# Patient Record
Sex: Male | Born: 1990
Health system: Southern US, Community
[De-identification: ages and names within clinical notes are randomized; demographics above are authoritative.]

## PROBLEM LIST (undated history)

## (undated) DIAGNOSIS — R569 Unspecified convulsions: Secondary | ICD-10-CM

## (undated) DIAGNOSIS — I509 Heart failure, unspecified: Secondary | ICD-10-CM

## (undated) DIAGNOSIS — M329 Systemic lupus erythematosus, unspecified: Secondary | ICD-10-CM

## (undated) DIAGNOSIS — B20 Human immunodeficiency virus [HIV] disease: Secondary | ICD-10-CM

## (undated) DIAGNOSIS — Z21 Asymptomatic human immunodeficiency virus [HIV] infection status: Secondary | ICD-10-CM

## (undated) DIAGNOSIS — IMO0002 Reserved for concepts with insufficient information to code with codable children: Secondary | ICD-10-CM

## (undated) DIAGNOSIS — F319 Bipolar disorder, unspecified: Secondary | ICD-10-CM

## (undated) HISTORY — PX: TONSILLECTOMY: SUR1361

---

## 2012-01-07 LAB — URINALYSIS W/ RFLX MICROSCOPIC
Bilirubin: NEGATIVE
Blood: NEGATIVE
Glucose: NEGATIVE MG/DL
Ketone: NEGATIVE MG/DL
Leukocyte Esterase: NEGATIVE
Nitrites: NEGATIVE
Protein: NEGATIVE MG/DL
Specific gravity: 1.025 (ref 1.003–1.030)
Urobilinogen: 0.2 EU/DL (ref 0.2–1.0)
pH (UA): 6.5 (ref 5.0–8.0)

## 2012-01-07 LAB — INFLUENZA A & B AG (RAPID TEST)
Influenza A Antigen: NEGATIVE
Influenza B Antigen: NEGATIVE

## 2012-01-07 MED ORDER — HYDROCODONE 10 MG-CHLORPHENIRAMINE 8 MG/5 ML ORAL SUSP EXTEND.REL 12HR
10-8 mg/5 mL | Freq: Two times a day (BID) | ORAL | Status: AC | PRN
Start: 2012-01-07 — End: ?

## 2012-01-07 MED ORDER — CEFTRIAXONE 250 MG SOLUTION FOR INJECTION
250 mg | INTRAMUSCULAR | Status: AC
Start: 2012-01-07 — End: 2012-01-07
  Administered 2012-01-07: 23:00:00 via INTRAMUSCULAR

## 2012-01-07 MED ORDER — AZITHROMYCIN 250 MG TAB
250 mg | ORAL | Status: AC
Start: 2012-01-07 — End: 2012-01-07
  Administered 2012-01-07: 23:00:00 via ORAL

## 2012-01-07 MED ORDER — METOPROLOL TARTRATE 50 MG TAB
50 mg | ORAL_TABLET | Freq: Two times a day (BID) | ORAL | Status: AC
Start: 2012-01-07 — End: ?

## 2012-01-07 NOTE — ED Notes (Signed)
P.a. Present in room now, vitals stable. No resp. Distress present, speaks in complete sentences.

## 2012-01-07 NOTE — ED Provider Notes (Signed)
I was personally available for consultation in the emergency department.  I have reviewed the chart and agree with the documentation recorded by the MLP, including the assessment, treatment plan, and disposition.  Marsella Suman L Kiante Ciavarella, MD

## 2012-01-07 NOTE — Discharge Instructions (Signed)
Talked with patient and a current copy of CAV schedule and medication discount card given to him. He is not able to afford [facility] copayment. Juliene Pina, Life Coach

## 2012-01-07 NOTE — ED Provider Notes (Addendum)
Patient is a 21 y.o. male presenting with vomiting and hand pain. The history is provided by the patient.   Vomiting   This is a new problem. Associated symptoms include a fever and cough.   Hand Pain      Pt presents c/o a productive cough, a fever, and CP with coughing.  Denies n/v/d, rash, SOB.  H/o HTN but hasn't taken Lopressor since Thanksgiving and doesn't have a PCP.      Past Medical History   Diagnosis Date   ??? Seizures      epilepsy   ??? Hypertension         Past Surgical History   Procedure Date   ??? Hx tonsillectomy    ??? Hx tonsillectomy          No family history on file.     History     Social History   ??? Marital Status: DIVORCED     Spouse Name: N/A     Number of Children: N/A   ??? Years of Education: N/A     Occupational History   ??? Not on file.     Social History Main Topics   ??? Smoking status: Current Some Day Smoker   ??? Smokeless tobacco: Not on file   ??? Alcohol Use: No   ??? Drug Use: No   ??? Sexually Active:      Other Topics Concern   ??? Not on file     Social History Narrative   ??? No narrative on file                  ALLERGIES: Amoxicillin      Review of Systems   Constitutional: Positive for fever.   Respiratory: Positive for cough.    Cardiovascular: Positive for chest pain.   Gastrointestinal: Positive for vomiting.       Filed Vitals:    01/07/12 1521 01/07/12 1616   BP: 137/81 142/84   Pulse: 87 78   Temp: 98.3 ??F (36.8 ??C)    Resp: 16 16   Height: 5\' 5"  (1.651 m)    Weight: 70.761 kg (156 lb)    SpO2: 100% 100%            Physical Exam   Constitutional: He is oriented to person, place, and time. He appears well-developed and well-nourished. No distress.   HENT:   Head: Normocephalic and atraumatic.   Eyes: EOM are normal.   Neck: Normal range of motion. Neck supple.   Cardiovascular: Normal rate and regular rhythm.    Pulmonary/Chest: Effort normal. No respiratory distress. He has no wheezes. He has no rales. He exhibits no tenderness.   Abdominal: Soft. There is no tenderness.    Musculoskeletal: He exhibits no edema.   Neurological: He is alert and oriented to person, place, and time. No cranial nerve deficit. Coordination normal.   Skin: Skin is warm and dry.   Psychiatric: His behavior is normal.        MDM     Differential Diagnosis; Clinical Impression; Plan:     Pt with cough and normal CXR; VS good and is afebrile.  Negative influenza.  Discussed importance of HTN meds w/pt.  Life coach assistance needed for f/up.  Pt also c/o dysuria to nurse.  Will treat for STI.  Amount and/or Complexity of Data Reviewed:   Clinical lab tests:  Ordered and reviewed  Tests in the radiology section of CPT??:  Ordered and reviewed  Discuss the patient with another provider:  Yes      Procedures

## 2012-01-07 NOTE — ED Notes (Signed)
laast seizure was Dec.25th,2012, has not been taking his meds for about 3-4 weeks,"ran out, no primary m.d., just moved".

## 2012-03-18 ENCOUNTER — Inpatient Hospital Stay
Admit: 2012-03-18 | Discharge: 2012-03-19 | Discharge status: Against Medical Advice | Payer: Self-pay | Attending: Internal Medicine | Admitting: Internal Medicine

## 2012-03-18 DIAGNOSIS — G40401 Other generalized epilepsy and epileptic syndromes, not intractable, with status epilepticus: Secondary | ICD-10-CM

## 2012-03-18 LAB — PHENOBARBITAL LEVEL: Phenobarbital: <2.1 ug/mL — ABNORMAL LOW (ref 15.0–40.0)

## 2012-03-18 LAB — PTT: aPTT: 29.4 s (ref 24.6–37.7)

## 2012-03-18 LAB — DRUG SCREEN, URINE
AMPHETAMINES: NEGATIVE
BARBITURATES: NEGATIVE
BENZODIAZEPINES: NEGATIVE
COCAINE: POSITIVE — AB
METHADONE: NEGATIVE
OPIATES: NEGATIVE
PCP(PHENCYCLIDINE): NEGATIVE
THC (TH-CANNABINOL): POSITIVE — AB

## 2012-03-18 LAB — CBC WITH AUTOMATED DIFF
ABS. BASOPHILS: 0 10*3/uL (ref 0.0–0.06)
ABS. EOSINOPHILS: 0.1 10*3/uL (ref 0.0–0.4)
ABS. LYMPHOCYTES: 2.2 10*3/uL (ref 0.9–3.6)
ABS. MONOCYTES: 0.3 10*3/uL (ref 0.05–1.2)
ABS. NEUTROPHILS: 2.2 10*3/uL (ref 1.8–8.0)
BASOPHILS: 1 % (ref 0–2)
EOSINOPHILS: 3 % (ref 0–5)
HCT: 46.6 % (ref 36.0–48.0)
HGB: 16.1 g/dL — ABNORMAL HIGH (ref 13.0–16.0)
LYMPHOCYTES: 45 % (ref 21–52)
MCH: 32.3 PG (ref 24.0–34.0)
MCHC: 34.5 g/dL (ref 31.0–37.0)
MCV: 93.4 FL (ref 74.0–97.0)
MONOCYTES: 7 % (ref 3–10)
MPV: 9.8 FL (ref 9.2–11.8)
NEUTROPHILS: 44 % (ref 40–73)
PLATELET: 290 10*3/uL (ref 135–420)
RBC: 4.99 M/uL (ref 4.70–5.50)
RDW: 12.9 % (ref 11.6–14.5)
WBC: 4.9 10*3/uL (ref 4.6–13.2)

## 2012-03-18 LAB — EKG, 12 LEAD, INITIAL
Atrial Rate: 101 {beats}/min
Calculated P Axis: 62 degrees
Calculated R Axis: 67 degrees
Calculated T Axis: 39 degrees
P-R Interval: 124 ms
Q-T Interval: 314 ms
QRS Duration: 80 ms
QTC Calculation (Bezet): 407 ms
Ventricular Rate: 101 {beats}/min

## 2012-03-18 LAB — METABOLIC PANEL, BASIC
Anion gap: 15 mmol/L (ref 3.0–18)
Anion gap: 9 mmol/L (ref 3.0–18)
BUN/Creatinine ratio: 12 (ref 12–20)
BUN/Creatinine ratio: 11 — ABNORMAL LOW (ref 12–20)
BUN: 17 MG/DL (ref 7.0–18)
BUN: 13 MG/DL (ref 7.0–18)
CO2: 18 MMOL/L — ABNORMAL LOW (ref 21–32)
CO2: 25 MMOL/L (ref 21–32)
Calcium: 8.8 MG/DL (ref 8.5–10.1)
Calcium: 9 MG/DL (ref 8.5–10.1)
Chloride: 105 MMOL/L (ref 100–108)
Chloride: 107 MMOL/L (ref 100–108)
Creatinine: 1.39 MG/DL — ABNORMAL HIGH (ref 0.6–1.3)
Creatinine: 1.23 MG/DL (ref 0.6–1.3)
GFR est AA: >60 mL/min/{1.73_m2} (ref >60)
GFR est AA: >60 mL/min/{1.73_m2} (ref >60)
GFR est non-AA: >60 mL/min/{1.73_m2} (ref >60)
GFR est non-AA: >60 mL/min/{1.73_m2} (ref >60)
Glucose: 101 MG/DL — ABNORMAL HIGH (ref 74–99)
Glucose: 87 MG/DL (ref 74–99)
Potassium: 3.5 MMOL/L (ref 3.5–5.5)
Potassium: 3.7 MMOL/L (ref 3.5–5.5)
Sodium: 138 MMOL/L (ref 136–145)
Sodium: 141 MMOL/L (ref 136–145)

## 2012-03-18 LAB — URINALYSIS W/ RFLX MICROSCOPIC
Bilirubin: NEGATIVE
Blood: NEGATIVE
Glucose: NEGATIVE MG/DL
Ketone: 15 MG/DL — AB
Leukocyte Esterase: NEGATIVE
Nitrites: NEGATIVE
Specific gravity: 1.025 (ref 1.003–1.030)
Urobilinogen: 0.2 EU/DL (ref 0.2–1.0)
pH (UA): 6.5 (ref 5.0–8.0)

## 2012-03-18 LAB — CARDIAC PANEL,(CK, CKMB & TROPONIN)
CK - MB: 1.6 ng/ml (ref 0.5–3.6)
CK-MB Index: 0.5 % (ref 0.0–4.0)
CK: 312 U/L — ABNORMAL HIGH (ref 39–308)
Troponin-I, QT: <0.02 NG/ML (ref 0.0–0.045)

## 2012-03-18 LAB — LACTIC ACID: Lactic acid: 8.5 MMOL/L — ABNORMAL HIGH (ref 0.4–2.0)

## 2012-03-18 LAB — PROTHROMBIN TIME + INR
INR: 1.1 (ref 0.8–1.2)
Prothrombin time: 14.2 s (ref 11.5–15.2)

## 2012-03-18 LAB — HEPATIC FUNCTION PANEL
A-G Ratio: 1.5 (ref 0.8–1.7)
ALT (SGPT): 24 U/L (ref 12.0–78.0)
AST (SGOT): 20 U/L (ref 15–37)
Albumin: 4.6 g/dL (ref 3.4–5.0)
Alk. phosphatase: 87 U/L (ref 50–136)
Bilirubin, direct: 0.2 MG/DL (ref 0.0–0.2)
Bilirubin, total: 0.6 MG/DL (ref 0.2–1.0)
Globulin: 3 g/dL (ref 2.0–4.0)
Protein, total: 7.6 g/dL (ref 6.4–8.2)

## 2012-03-18 LAB — ETHYL ALCOHOL: ALCOHOL(ETHYL),SERUM: <3 MG/DL (ref 0–3)

## 2012-03-18 LAB — URINE MICROSCOPIC ONLY
Bacteria: NEGATIVE /HPF
RBC: 0 /HPF (ref 0–5)
WBC: 0 /HPF (ref 0–4)

## 2012-03-18 LAB — MAGNESIUM: Magnesium: 1.8 MG/DL (ref 1.8–2.4)

## 2012-03-18 LAB — GLUCOSE, POC: Glucose (POC): 95 mg/dL (ref 70–110)

## 2012-03-18 LAB — CARBAMAZEPINE: Carbamazepine: <0.5 ug/mL — ABNORMAL LOW (ref 4–12)

## 2012-03-18 LAB — PHENYTOIN: Phenytoin: 0.8 ug/mL — ABNORMAL LOW (ref 10.0–20.0)

## 2012-03-18 LAB — PHOSPHORUS: Phosphorus: 2.8 MG/DL (ref 2.5–4.9)

## 2012-03-18 MED ORDER — POTASSIUM CHLORIDE 10 MEQ/100 ML IV PIGGY BACK
10 mEq/0 mL | INTRAVENOUS | Status: AC
Start: 2012-03-18 — End: 2012-03-18
  Administered 2012-03-18 – 2012-03-19 (×2): via INTRAVENOUS

## 2012-03-18 MED ORDER — LORAZEPAM 2 MG/ML IJ SOLN
2 mg/mL | INTRAMUSCULAR | Status: AC
Start: 2012-03-18 — End: 2012-03-19

## 2012-03-18 MED ORDER — LORAZEPAM 2 MG/ML IJ SOLN
2 mg/mL | INTRAMUSCULAR | Status: AC
Start: 2012-03-18 — End: 2012-03-18
  Administered 2012-03-18: 13:00:00 via INTRAVENOUS

## 2012-03-18 MED ADMIN — levETIRAcetam (KEPPRA) 1,000 mg in 0.9% sodium chloride IVPB: INTRAVENOUS | @ 14:00:00 | NDC 39822400001

## 2012-03-18 MED ADMIN — LORazepam (ATIVAN) 2 mg/mL injection: INTRAVENOUS | @ 18:00:00 | NDC 00641604401

## 2012-03-18 MED ADMIN — enoxaparin (LOVENOX) injection 40 mg: SUBCUTANEOUS | @ 23:00:00 | NDC 00075062040

## 2012-03-18 MED ADMIN — dextrose 5% and 0.9% NaCl infusion: INTRAVENOUS | @ 23:00:00 | NDC 00409794109

## 2012-03-18 MED ADMIN — LORazepam (ATIVAN) 2 mg/mL injection: INTRAVENOUS | @ 13:00:00 | NDC 00641604401

## 2012-03-18 MED ADMIN — ondansetron (ZOFRAN) 4 mg/2 mL injection: @ 13:00:00 | NDC 00781301072

## 2012-03-18 MED ADMIN — levETIRAcetam (KEPPRA) 1,000 mg in 0.9% sodium chloride IVPB: INTRAVENOUS | @ 23:00:00 | NDC 39822400001

## 2012-03-18 MED ADMIN — fosphenytoin (CEREBYX) 1,000 mg PE in 0.9% sodium chloride 100 mL IVPB: INTRAVENOUS | @ 19:00:00 | NDC 63323040310

## 2012-03-18 MED FILL — LORAZEPAM 2 MG/ML IJ SOLN: 2 mg/mL | INTRAMUSCULAR | Qty: 1

## 2012-03-18 MED FILL — LEVETIRACETAM 500 MG/5 ML IV SOLN: 500 mg/5 mL | INTRAVENOUS | Qty: 10

## 2012-03-18 MED FILL — POTASSIUM CHLORIDE 10 MEQ/100 ML IV PIGGY BACK: 10 mEq/0 mL | INTRAVENOUS | Qty: 100

## 2012-03-18 MED FILL — DEXTROSE 5% IN NORMAL SALINE IV: INTRAVENOUS | Qty: 1000

## 2012-03-18 MED FILL — FLUARIX 2012-2013(PF) 45 MCG (15 MCG X 3)/0.5 ML INTRAMUSCULAR SYRINGE: 45 mcg (15 mcg x 3)/0.5 mL | INTRAMUSCULAR | Qty: 0.5

## 2012-03-18 MED FILL — FOSPHENYTOIN 500 MG PE/10 ML INJECTION: 500 mg PE/10 mL | INTRAMUSCULAR | Qty: 20

## 2012-03-18 MED FILL — ONDANSETRON (PF) 4 MG/2 ML INJECTION: 4 mg/2 mL | INTRAMUSCULAR | Qty: 2

## 2012-03-18 MED FILL — LOVENOX 40 MG/0.4 ML SUBCUTANEOUS SYRINGE: 40 mg/0.4 mL | SUBCUTANEOUS | Qty: 0.4

## 2012-03-18 NOTE — Other (Signed)
Pt asleep, no distress noted

## 2012-03-18 NOTE — ED Notes (Signed)
Patient now speaking with his male friend at gurney side, clarifying medication he is to take, he has not been taking for the last 2 years, and has about 2 seizures a year, last episode last summer. No seizure activity present, vitals stable. Tele nsr no ectopy present.

## 2012-03-18 NOTE — Consults (Signed)
Long history of seizures. He stopped medication when he lost his job, but he has positive drug screen for cocaine and THC.   Dictated, [id number]

## 2012-03-18 NOTE — ED Notes (Signed)
Eyes open, slurred speech, no seizure activity present, vitals stable, tele nsr

## 2012-03-18 NOTE — Other (Signed)
Pt co pain to IV site with potassium infusion, site intact, pt refused further infusion, Dr. Orlinda Blalock aware, orders received to D/C remaining KCL infusions

## 2012-03-18 NOTE — Consults (Signed)
[facility]                  [address] [place], [place]  [address]                                CONSULTATION REPORT    PATIENT:      Tony Mccarthy, Tony Mccarthy  MRN:              [id number]     ADMITTED:   03/18/2012  BILLING:          [id number]    CONSULTED:  03/18/2012  LOCATION:     [id number]  ATTENDING:    Kristie Cowman, MD  CONSULTING:   Dorna Leitz, MD        REFERRING PHYSICIAN: Dr. Robin Searing.    REASON FOR REFERRAL: Seizure.    HISTORY OF PRESENT ILLNESS: The patient is lethargic and does not give a  very good history. However, he appears to have a long history of seizure  disorder, the reason for which is unclear, but he supposedly had been  taking Dilantin and phenobarbital until about a year ago when he said that  he lost his job. He had 4 seizures and was incompletely arousable in  between them and it was termed status epilepticus.    His drug screen was positive for cocaine and THC. CT scan of the head was  reviewed, and this was unremarkable, noncontrasted CT. Therapeutic drug  levels for phenytoin, carbamazepine, and phenobarbital were subtherapeutic.  The phenytoin level was reported as 0.8, although by his account, he has  not been taking it over the past year.    Serum glucose was 95. White count was 4.9. Urinalysis shows trace protein.  Sodium 141, calcium 9.0. ALT was mildly elevated at 24. Magnesium was 1.8.  Phosphorus 2.8.    PAST MEDICAL HISTORY: Significant for seizures and apparent drug abuse,  based on the drug screen.    SOCIAL HISTORY: He is single and unemployed.    PHYSICAL EXAMINATION  GENERAL: He is awake but lethargic. He is in no acute distress,  well-developed, well-nourished, and less than fully cooperative but seemed  to do fairly well following 2-step commands.  HEART: Regular rate and rhythm. Peripheral edema is absent. No carotid  bruits were heard.  MENTAL STATUS: He is lethargic and did not answer orientation questions.   Speech was slurred.  CRANIAL NERVES: His face is symmetric. His eye movements are full. Pupils  are 4 mm and reactive bilaterally. Visual fields are intact. His hearing  seemed to be normal bilaterally. Shoulder shrug was normal. Normal facial  sensation. He apparently had bitten the right side of his tongue.  Funduscopic was difficult to do. Pupils are 4 mm and reactive. MOTOR  EXAMINATION: Station and gait were not tested as he is in the ICU. Muscle  tone was normal. He moved all 4 extremities well.  SENSORY: Intact to light touch in all 4 extremities.  COORDINATION: Was difficult due to poor cooperation.  REFLEXES: Were 2+, plantar reflexes were flexor.    IMPRESSION: Long history of seizures with superimposed multiple seizures  today. This may be in part due to cocaine use.    RECOMMENDATION: He has been started on medication including Keppra and  Phenytoin. Will have him use just phenytoin, as this may be less expensive, and he is more likely to continue it if  he is only on one medication.  Will check an EEG and MRI of the brain   He will need to maintain these medications. He was advised that  he should not be driving. He will need a followup clinic visit to help him  obtain medications. Discontinuation of recreational drug abuse is  appropriate.                                                                   Dorna Leitz, MD        ADR:wmx  D: 03/18/2012  6:49 P T: 03/18/2012  7:15 P  Job #:  [id number]  CScriptDoc #:  [id number]  cc:   Kristie Cowman, MD        Dorna Leitz, MD

## 2012-03-18 NOTE — Other (Signed)
TRANSFER - OUT REPORT:    Verbal report given to [facility]. on Tony Mccarthy  being transferred to ICU-2705-01 for routine progression of care       Report consisted of patient???s Situation, Background, Assessment and   Recommendations(SBAR).     Information from the following report(s) SBAR, Kardex, ED Summary, Intake/Output and MAR was reviewed with the receiving nurse.    Opportunity for questions and clarification was provided.      Report given [id number].

## 2012-03-18 NOTE — Other (Signed)
Pt family at bedside, pt crying remains upset, pt arched back, had no tremors noted, when arm raised pt allowed arm to fall freely to bed beside him, however did not allow arm to fall toward patients head, no distress noted, will continue to monitor

## 2012-03-18 NOTE — ED Provider Notes (Signed)
HPI Comments: Tony Mccarthy is a 21 y.o. Male with hx of epilepsy who was brought to the ED by a friend s/p a seizure. Patient was actively seizing upon arrival to the main treatment area of the ED. Friend reported in the ED waiting room that the patient had 2 seizures prior to arriving to the ED. The patient is noted to have urinary incontinence. No further hx is available at this time.       The history is limited by the condition of the patient.        No past medical history on file.     No past surgical history on file.      No family history on file.     History     Social History   ??? Marital Status: SINGLE     Spouse Name: N/A     Number of Children: N/A   ??? Years of Education: N/A     Occupational History   ??? Not on file.     Social History Main Topics   ??? Smoking status: Not on file   ??? Smokeless tobacco: Not on file   ??? Alcohol Use: Not on file   ??? Drug Use: Not on file   ??? Sexually Active: Not on file     Other Topics Concern   ??? Not on file     Social History Narrative   ??? No narrative on file                  ALLERGIES: Review of patient's allergies indicates not on file.      Review of Systems   Unable to perform ROS: Mental status change   Patient is postictal s/p 4 seizures (2 at home and 2 in the ED).      There were no vitals filed for this visit.         Physical Exam   Nursing note and vitals reviewed.  Constitutional: He appears well-developed and well-nourished.   HENT:   Head: Normocephalic and atraumatic.   Right Ear: External ear normal.   Left Ear: External ear normal.   Mouth/Throat: Oropharynx is clear and moist. No oropharyngeal exudate.        There is no blood or fluid from the ears, nose, or mouth.    Eyes: Conjunctivae and EOM are normal. Pupils are equal, round, and reactive to light. No scleral icterus. Right eye exhibits no nystagmus.        No pallor. His pupils are 3mm.    Neck: Normal range of motion. Neck supple. No JVD present. No tracheal deviation present. No thyromegaly  present.   Cardiovascular: Regular rhythm and normal heart sounds.  Tachycardia present.    Pulmonary/Chest: Effort normal and breath sounds normal. No stridor. No respiratory distress.   Abdominal: Soft. Bowel sounds are normal. He exhibits no distension. There is no tenderness. There is no rebound and no guarding.   Musculoskeletal: Normal range of motion. He exhibits no edema and no tenderness.        No soft tissue injuries   Lymphadenopathy:     He has no cervical adenopathy.   Neurological: GCS eye subscore is 2. GCS verbal subscore is 2. GCS motor subscore is 5.        He weakly follows commands with poor coordination.    Skin: Skin is warm and dry. No rash noted. He is not diaphoretic. No erythema.   Psychiatric:  He is unresponsive. Unable to access.         MDM     Differential Diagnosis; Clinical Impression; Plan:     Multiple seizures en route and in the dept, witnessed, very dramatic in nature (requiring many staff members to assist in controlling him from self injury); leads to some suspicion of dubious origin (psychgenic); Never returned to baseline cognition between events = pt admitted and thorough work up pursued  Amount and/or Complexity of Data Reviewed:   Clinical lab tests:  Ordered and reviewed  Tests in the radiology section of CPT??:  Ordered and reviewed  Tests in the medicine section of the CPT??:  Ordered and reviewed  Discussion of test results with the performing providers:  Yes   Decide to obtain previous medical records or to obtain history from someone other than the patient:  Yes   Obtain history from someone other than the patient:  Yes   Review and summarize past medical records:  Yes   Discuss the patient with another provider:  Yes   Independant visualization of image, tracing, or specimen:  Yes  Risk of Significant Complications, Morbidity, and/or Mortality:   Presenting problems:  High  Diagnostic procedures:  High  Management options:  High  Critical Care:   Total time  providing critical care:  30-74 minutes  Progress:   Patient progress:  Improved      Procedures    PROGRESS NOTES  9:05 AM: Patient arrived to the main treatment area by ED RN in a wheelchair actively seizing. ER physician, Nada Libman, MD immediately at the bedside. Ativan ordered.  9:10 AM: Ativan 2mg  IV administered to the patient via the ED RN.   9:12 AM: Patient began seizing again during evaluation by the ER physician. Another 2mg  of Ativan IV ordered.   9:15 AM: Ativan 2mg  IV administered to the patient via ED RN.   11:43 AM: The patient remains the same. Reviewed radiology results with the patient's friend. Reviewed laboratory results with the patient"s friend. The patient is being admitted.       CONSULTATIONS  11:30 AM: Discussed care with Dr Robin Searing. Standard discussion, including history of patient's chief complaint, available diagnostic results, and treatment course was discussed. The Hospitalist will be coming down to the ED to evaluate the patient for admission.       SCRIBE ATTESTATION STATMENT  Provider documentation is written by Dulce Sellar. [place]arf, acting as Neurosurgeon for Nada Libman, MD.    I, Nada Libman, MD, have reviewed the information recorded by the Scribe and agree with its contents.         Diagnosis:   1. Status epilepticus          Disposition: Admitted    Follow-up Information     None          Patient's Medications   Start Taking    No medications on file   Continue Taking    DIVALPROEX DR (DEPAKOTE) 500 MG TABLET    Take 500 mg by mouth two (2) times a day.    PHENYTOIN ER (DILANTIN EXTENDED) 100 MG ER CAPSULE    Take  by mouth daily.   These Medications have changed    No medications on file   Stop Taking    No medications on file           .

## 2012-03-18 NOTE — Other (Signed)
Pt co pain at iv site with potassium infusion, site intact, positive blood return, infusion rate slowed for patient comfort

## 2012-03-18 NOTE — Other (Signed)
Bedside and Verbal shift change report given to Genia Plants RN (oncoming nurse) by Quincy Simmonds, RN   (offgoing nurse).  Report given with SBAR, Kardex, Intake/Output, MAR and Recent Results.

## 2012-03-18 NOTE — Other (Signed)
Pt in bed, request that SO be able to stay in room overnight, explained that patient can have visitors, but they are to stay in the waiting room, pt requesting to smoke, pt informed that hospital is a non smoking facility

## 2012-03-18 NOTE — Other (Signed)
Assessment complete, received pt in bed, tearful, wants to make contact with partner, they had a dispute and pt is concerned why his partner will not call him, attempted to reassure patient, he is awake alert ox3, denies any co pain, vss , PIV x2 intact, will not wear O2, RA spo2 98%, HOB up will continue to monitor

## 2012-03-18 NOTE — ED Notes (Addendum)
No seizure activity present, vitals stable, tele nsr, iv cerebyx to be given, awaiting pharmacy.

## 2012-03-18 NOTE — Other (Signed)
Dr. Marcelle Overlie aware that MRI is not able to be done until AM, per MRI tech Kathlene November, if test is not ordered for stat, then MRI on call tech will have to be called in, Dr. Marcelle Overlie ok for test to be done in AM

## 2012-03-18 NOTE — ED Notes (Signed)
0905-Arrived in e.d. Lobby in a w/c with active seizure from a private auto ? Friend/family member drove him, ? Ran out of medicine. 4 staff picked him up while having an active seizure to the gurney, ACLS protocol, m.d.  Edilia Bo present, Iv placed immediate ativan given, cardiac monitor with tachycardia present 128, currently now no seizures present, sleeping, resp. Even/unlabored. 2nd IV placed, blood samples sent to the lab, Keppra completed iv route. Ready for ct scan.

## 2012-03-18 NOTE — H&P (Addendum)
Medicine History and Physical    Patient: Tony Mccarthy   Age:  21 y.o.    Assessment   Principal Problem:   *Status epilepticus (03/18/2012)  Active Problems:     ARF (acute renal failure) (03/18/2012)     Cocaine abuse (03/18/2012)        Plan   -Pt admitted with staus epilepticus and ARF  -Cont AEDs keppra and Fosphenytoin  -Ativan PRN seizure   -Neurology consult  -IVF  -Hydralazine PRN for BP  -Friend at bed indicated pt may be HIV+, will need to clarify when pt more alert        Chief Complaint: seizure    Pt unable to provide any meaningful Hx as he was postictal and confused, Info obtained from friend at bedside , ED s/o and available documentation   HPI:   Tony Mccarthy is a 21 y.o. year old male with h/o of medication non compliance, seizure disorder, HTN. Who was a passenger in car with friend when he asked friend to take him to the hospital as he felt he was going to have a seizure, pt was taken to the Ed where he had 2x seizures terminated with ativan  and was loaded with keppra, he had an additional 2 seizures after that. Limited ROS available through friend but says he is supposed to take 2x seizure meds but has not taken then in some time.     Past Medical History:  Past Medical History   Diagnosis Date   ??? Seizures        Past Surgical History:  Past Surgical History   Procedure Date   ??? Hx orthopaedic      back surgery 2012       Family History:  No family history on file.    Social History:  History     Social History   ??? Marital Status: SINGLE     Spouse Name: N/A     Number of Children: N/A   ??? Years of Education: N/A     Social History Main Topics   ??? Smoking status: Current Everyday Smoker -- 0.5 packs/day   ??? Smokeless tobacco: Not on file   ??? Alcohol Use: 0.5 oz/week     1 Shots of liquor per week   ??? Drug Use: Yes     Special: Marijuana, Utox + for cocaine   ??? Sexually Active:      Other Topics Concern   ??? Not on file     Social History Narrative   ??? No narrative on file       Home Medications:   Prior to Admission medications    Medication Sig Start Date End Date Taking? Authorizing Provider   divalproex DR (DEPAKOTE) 500 mg tablet Take 500 mg by mouth two (2) times a day.    Phys Other, MD   phenytoin ER (DILANTIN EXTENDED) 100 mg ER capsule Take  by mouth daily.    Phys Other, MD       Allergies:  Allergies   Allergen Reactions   ??? Pcn (Penicillins) Itching       Review of Systems  Review of systems not obtained due to patient factors.  remainder of ten point review of systems reviewed with patient and negative    Physical Exam:     Visit Vitals   Item Reading   ??? BP 130/83   ??? Pulse 97   ??? Resp 14   ??? SpO2 100%  Physical Exam:  General appearance: confused, aox1, answering some questions,   Head: Normocephalic, without obvious abnormality, atraumatic  Neck: supple, trachea midline  Lungs: clear to auscultation bilaterally  Heart: regular rate and rhythm, S1, S2 normal, no murmur, click, rub or gallop  Abdomen: soft, non-tender. Bowel sounds normal. No masses,  no organomegaly  Extremities: extremities normal, atraumatic, no cyanosis or edema  Skin: Skin color, texture, turgor normal. No rashes or lesions  Neurologic: Grossly normal    Intake and Output:  Current Shift:     Last three shifts:       Lab/Data Reviewed:  All lab results for the last 24 hours reviewed.    Chest X-Ray is obtained;not obtained     ZOX:WRUEA tach at 101      Kristie Cowman, MD  March 18, 2012

## 2012-03-18 NOTE — ED Notes (Signed)
While ICU m.d. @ [facility] side speaking with male friend, and patient answered appropriately he suddenly arched his back, suddenly all 4 extremities shaking. Then stopped all lasting about 20sec. Now sleeping, vitals stable, teel nsr, maintained o2 sat 100%.

## 2012-03-18 NOTE — Other (Addendum)
Sister at bedside, pt had violent jerking to entire body, back arched, body bowed to left side lying position, family asked to leave room, assessment complete, pt following commands, no loss of bowel or bladder, episode lasting approx 1 mins, pt does not appear post ictal, asking for food and water immediately, seizure precautions in place, pt co back pain, will give prn medication

## 2012-03-18 NOTE — ED Notes (Signed)
Male friend with him in the e.d. Rm.#4, no seizures present, vitals stable.

## 2012-03-18 NOTE — Other (Signed)
Pt on phone "i want to talk to my fiancee"  Percocet 1 po given for c/o back pain

## 2012-03-18 NOTE — ED Notes (Signed)
Seizure like activity with all 4 extremities tremors, m.d. Edilia Bo present, med. Given with seizure stopping. Resp. Even/unlabored. Vitals stable, tele nsr, plan now is to admit to the ICU, awaiting bed.

## 2012-03-18 NOTE — ED Notes (Signed)
No active seizures at this time, vitals stable, tele nsr no ectopy present.

## 2012-03-18 NOTE — Other (Signed)
Patient gave permission for sister Mrs. Pringle to obtain information, pt sister updated on status

## 2012-03-19 LAB — PHENYTOIN: Phenytoin: 19 ug/mL (ref 10.0–20.0)

## 2012-03-19 LAB — GLUCOSE, POC: Glucose (POC): 120 mg/dL — ABNORMAL HIGH (ref 70–110)

## 2012-03-19 MED ORDER — PHENYTOIN SODIUM EXTENDED 100 MG CAP
100 mg | ORAL_CAPSULE | Freq: Every day | ORAL | Status: DC
Start: 2012-03-19 — End: 2013-05-23

## 2012-03-19 MED ORDER — GADOPENTETATE DIMEGLUMINE 10 MMOL/20 ML IV SYRINGE
10 mmol/20 mL (469.01 mg/mL) | Freq: Once | INTRAVENOUS | Status: AC
Start: 2012-03-19 — End: 2012-03-19
  Administered 2012-03-19: 14:00:00 via INTRAVENOUS

## 2012-03-19 MED ADMIN — oxyCODONE-acetaminophen (PERCOCET) 5-325 mg per tablet 1 Tab: ORAL | @ 16:00:00 | NDC 00406051262

## 2012-03-19 MED ADMIN — LORazepam (ATIVAN) injection 2 mg: INTRAVENOUS | @ 16:00:00 | NDC 00641604401

## 2012-03-19 MED ADMIN — dextrose 5% and 0.9% NaCl infusion: INTRAVENOUS | @ 10:00:00 | NDC 00409794109

## 2012-03-19 MED ADMIN — oxyCODONE-acetaminophen (PERCOCET) 5-325 mg per tablet 1 Tab: ORAL | @ 12:00:00 | NDC 00406051262

## 2012-03-19 MED ADMIN — pantoprazole (PROTONIX) tablet 40 mg: ORAL | @ 16:00:00 | NDC 00008060704

## 2012-03-19 MED ADMIN — phenytoin ER (DILANTIN ER) ER capsule 300 mg: ORAL | @ 01:00:00 | NDC 68084037611

## 2012-03-19 MED ADMIN — oxyCODONE-acetaminophen (PERCOCET) 5-325 mg per tablet 1 Tab: ORAL | @ 22:00:00 | NDC 00406051262

## 2012-03-19 MED ADMIN — oxyCODONE-acetaminophen (PERCOCET) 5-325 mg per tablet 1 Tab: ORAL | @ 01:00:00 | NDC 00406051262

## 2012-03-19 MED FILL — PANTOPRAZOLE 40 MG TAB, DELAYED RELEASE: 40 mg | ORAL | Qty: 1

## 2012-03-19 MED FILL — OXYCODONE-ACETAMINOPHEN 5 MG-325 MG TAB: 5-325 mg | ORAL | Qty: 1

## 2012-03-19 MED FILL — PHENYTOIN SODIUM EXTENDED 100 MG CAP: 100 mg | ORAL | Qty: 3

## 2012-03-19 MED FILL — POTASSIUM CHLORIDE 10 MEQ/100 ML IV PIGGY BACK: 10 mEq/0 mL | INTRAVENOUS | Qty: 100

## 2012-03-19 MED FILL — NICOTINE 21 MG/24 HR DAILY PATCH: 21 mg/24 hr | TRANSDERMAL | Qty: 1

## 2012-03-19 MED FILL — LORAZEPAM 2 MG/ML IJ SOLN: 2 mg/mL | INTRAMUSCULAR | Qty: 1

## 2012-03-19 MED FILL — MAGNEVIST 10 MMOL/20 ML (469.01 MG/ML) INTRAVENOUS SYRINGE: 10 mmol/20 mL (469.01 mg/mL) | INTRAVENOUS | Qty: 20

## 2012-03-19 NOTE — Progress Notes (Signed)
Patient informed a nurse passing by that he wanted to go home. When I talked to him, he told me he wanted to smoke a cigarette and really wanted to leave. I encouraged him to stay, especially since I had just administered a Percocet. He stated that he was not driving, and his sister who was present was driving him.

## 2012-03-19 NOTE — Other (Signed)
No seizure activity noted

## 2012-03-19 NOTE — Other (Signed)
Visitors at bedside, will monitor

## 2012-03-19 NOTE — Other (Signed)
TRANSFER - OUT REPORT:    Verbal report given to [facility] (name) on Tony Mccarthy  being transferred to [facility](unit) for routine progression of care       Report consisted of patient???s Situation, Background, Assessment and   Recommendations(SBAR).     Information from the following report(s) Kardex, MAR and Recent Results was reviewed with the receiving nurse.    Opportunity for questions and clarification was provided.

## 2012-03-19 NOTE — Progress Notes (Signed)
Dr Sharion Dove called back and asked if patient was alert. Patient is alert, but drowsy. Since he did not hit his head, no CT ordered at this time. Vital signs remain WNL.  Bed alarm turned on and fall risk band and sign applied. Will continue to monitor patients status.

## 2012-03-19 NOTE — Other (Signed)
Verbal shift change report given to Stephanie Braun, RN (oncoming nurse) by CLARE M ROSENKRANTZ, RN   (offgoing nurse).  Report given with Kardex, MAR and Recent Results.

## 2012-03-19 NOTE — Progress Notes (Signed)
Notified by charge nurse that patient had changed his clothes and was asking to have someone remove his IV's. When I came to talk to the patient, he was already standing at the door and had signed AMA paperwork. Dr Sharion Dove had been called and consulted the patient. Patient, doctor, charge nurse and witnessing nurse all signed AMA report. Patient then walked out voluntarily.

## 2012-03-19 NOTE — Progress Notes (Signed)
Transport at bedside to take pt to MRI. Pt has transfer orders. Nurse called [facility] to give report

## 2012-03-19 NOTE — Other (Signed)
Pt again on phone trying to locate SO, contacting several different family members, no seizure activity noted

## 2012-03-19 NOTE — Other (Signed)
Pt asleep, vss, no distress noted

## 2012-03-19 NOTE — Other (Signed)
Pt agrees to have PIV inserted, #20 placed to left forearm x1 attempt, pt tolerated well, IV fluids resumed

## 2012-03-19 NOTE — Progress Notes (Signed)
Neurology Progress Note    Admit Date: 03/18/2012  Primary Care: None   Principle Problem: Status epilepticus   Interim History:  He is sleepy, his dilantin level is 19. He has not had any seizures. He tells me at this time that he is homeless living in his car. He told me yesterday that he is not driving. He will need the help of social services. His MRI of the brain is unremarkable.     Allergies:   Allergies   Allergen Reactions   ??? Pcn (Penicillins) Itching     Review of Systems:    Constitutional: []  recent weight change  [x]  no fever  [x]  sleep difficulties   ENT/Mouth:  []  hearing loss  []  ringing in the ears   Cardiovascular:  []  chest pain   []  palpitations    Respiratory: []  frequent cough  []  shortness of breath  []  Sleep apnea  []  intubated   Gastrointestinal: []  abdominal pain  []  nausea,    Genitourinary: []  frequent urination  []  incontinence    Musculoskeletal:   []  joint pain  []  muscle pain   Integument:   []  rash/itching  []  change in color/size of moles   Neurological:  []  dizziness/vertigo  []  sedation  []  confusion  [x]  no agitation/combativeness  []  loss of consciousness  []  numbness/tingling sensation  [x]  no tremors  [x]  no weakness in limbs  []  difficulty with balance  []  frequent or recurring headaches  []  memory loss   []  comatose   Psychiatric:   []  depression  []  hallucinations   Endocrine: []  excessive thirst or urination   []  heat or cold intolerance   Hematologic/Lymphatic: []  bleeding tendency  []  Enlarged lymph nodes       Vital Signs:   Visit Vitals   Item Reading   ??? BP 126/77   ??? Pulse 90   ??? Temp 98.3 ??F (36.8 ??C)   ??? Resp 18   ??? Ht 5\' 5"  (1.651 m)   ??? Wt 65.772 kg (145 lb)   ??? BMI 24.13 kg/m2   ??? SpO2 100%      Neurological examination:     Appearance: In no acute distress, well developed, well nourished, cooperative    Cardiovascular: Heart is regular rate and rhythm, peripheral edema is absent.     Mental status examination: Alert and oriented X 3. Normal speech and language.      Cranial nerves: Face, tongue and uvula are symmetric, EOMI with some gaze evoked nystagmus.     Motor exam:  Muscle tone and manual muscle testing are normal.     Coordination: Normal rapid alternating movements,     Problem List: Principal Problem:   *Status epilepticus (03/18/2012)  Active Problems:     ARF (acute renal failure) (03/18/2012)     Cocaine abuse (03/18/2012)     Nicotine dependence (03/19/2012)      Assessment:    Seizure disorder, noncompliance with medication     Plan:    Continue Dilantin, 300mg  a day  Will need clinic follow up, social services to assist with shelter    Medications:      Current Facility-Administered Medications   Medication Dose Route Frequency   ??? nicotine (NICODERM CQ) 21 mg/24 hr patch 1 Patch  1 Patch TransDERmal DAILY   ??? pantoprazole (PROTONIX) tablet 40 mg  40 mg Oral DAILY   ??? enoxaparin (LOVENOX) injection 30 mg  30 mg SubCUTAneous Q24H   ??? gadopentetate dimeglumine (  MAGNEVIST) 10 mmol/20 mL (469.01 mg/mL) contrast syringe 11-20 mL  11-20 mL IntraVENous RAD ONCE   ??? acetaminophen (TYLENOL) tablet 650 mg  650 mg Oral Q4H PRN   ??? oxyCODONE-acetaminophen (PERCOCET) 5-325 mg per tablet 1 Tab  1 Tab Oral Q4H PRN   ??? acetaminophen (TYLENOL) tablet 650 mg  650 mg Oral Q4H PRN   ??? ondansetron (ZOFRAN) injection 4 mg  4 mg IntraVENous Q4H PRN   ??? magnesium hydroxide (MILK OF MAGNESIA) oral suspension 30 mL  30 mL Oral DAILY PRN   ??? LORazepam (ATIVAN) injection 2 mg  2 mg IntraVENous Q5MIN PRN   ??? LORazepam (ATIVAN) injection 4 mg  4 mg IntraVENous NOW   ??? potassium chloride 10 mEq in 100 ml IVPB  10 mEq IntraVENous Q1H   ??? influenza vaccine 2012-2013 (PF)(latex) (FLUARIX,FLUVIRIN) injection 0.5 mL  0.5 mL IntraMUSCular PRIOR TO DISCHARGE   ??? pneumococcal 23-valent (PNEUMOVAX 23) injection 0.5 mL  0.5 mL IntraMUSCular PRIOR TO DISCHARGE   ??? phenytoin ER (DILANTIN ER) ER capsule 300 mg  300 mg Oral QHS        Data:      Recent Results (from the past 24 hour(s))   METABOLIC PANEL,  BASIC    Collection Time    03/18/12  6:20 PM       Component Value Range    Sodium 141  136 - 145 MMOL/L    Potassium 3.7  3.5 - 5.5 MMOL/L    Chloride 107  100 - 108 MMOL/L    CO2 25  21 - 32 MMOL/L    Anion gap 9  3.0 - 18 mmol/L    Glucose 87  74 - 99 MG/DL    BUN 13  7.0 - 18 MG/DL    Creatinine 2.95  0.6 - 1.3 MG/DL    BUN/Creatinine ratio 11 (*) 12 - 20      GFR est AA >60  >60 ml/min/1.25m2    GFR est non-AA >60  >60 ml/min/1.77m2    Calcium 9.0  8.5 - 10.1 MG/DL   PHENYTOIN    Collection Time    03/19/12  3:15 AM       Component Value Range    Phenytoin 19.0  10.0 - 20.0 ug/mL            Daily Progress Note      Review of Systems      Physical Exam

## 2012-03-19 NOTE — Other (Addendum)
Informed by CNA that patient had fallen on the floor. When I arrived, the patient was on the floor with eyes closed. Per family member, patient started to stand and starting having a seizure and fell. Family member reported that he did not hit his head. There was no seizure activity when I came into the room. Patients vital signs were taken: temp 98.4   HR 88   RR 18   O2 100% BP  116/62.  Dr Sharion Dove paged.

## 2012-03-19 NOTE — Progress Notes (Signed)
Completed shift assessment, pt pleasant, eager to be transferred to regular floor and start diet. Pt due for MRI, nurse called tech and tech advised will be available shortly and will call transport. Per night nurse and pt, no episodes of seizure since yesterday evening. Pt currently has on a bair hugger, due to being cold. Temp is 98.5. Nurse paged Dr. Erick Colace to advise of pt's request on diet and transfer.

## 2012-03-19 NOTE — Other (Signed)
Pt resting quietly, states "If I don't have another seizure, can I go home tomorrow?" explained to patient that he would need to discuss disposition with MD in am, pt agrees, call bell in reach, will continue to monitor

## 2012-03-19 NOTE — Progress Notes (Signed)
Medicine Progress Note    Patient: Tony Mccarthy   Age:  21 y.o.  DOA: 03/18/2012   Admit Dx / CC: status epilepticus  Status epilepticus  LOS:  LOS: 1 day     Subjective:   Patient seen and examined. States he feels better - No CP/SOB/N/V/Abdominal pain this morning.    Objective:   Patient Vitals for the past 24 hrs:   Temp Pulse Resp BP SpO2   03/19/12 0800 - 93  15  125/50 mmHg -   03/19/12 0753 98.5 ??F (36.9 ??C) - - - -   03/19/12 0750 - 87  13  134/53 mmHg -   03/19/12 0600 - 85  19  - -   03/19/12 0500 - 79  15  - -   03/19/12 0400 98.6 ??F (37 ??C) 75  19  106/54 mmHg -   03/19/12 0300 - 86  17  133/86 mmHg -   03/19/12 0200 - 86  16  - 86 %   03/19/12 0100 - 78  20  133/85 mmHg 100 %   03/19/12 0000 98.5 ??F (36.9 ??C) 87  19  102/67 mmHg 100 %   03/18/12 2300 - - - 111/59 mmHg 100 %   03/18/12 2200 - 90  18  128/58 mmHg 100 %   03/18/12 2118 - 97  - 124/74 mmHg -   03/18/12 2117 - - 19  - 99 %   03/18/12 2100 - 96  20  - -   03/18/12 2000 98.6 ??F (37 ??C) 83  16  120/65 mmHg 99 %   03/18/12 1800 - 92  19  121/64 mmHg 100 %   03/18/12 1745 - 88  16  111/57 mmHg -   03/18/12 1740 - - - - 100 %   03/18/12 1630 - 92  - - 100 %   03/18/12 1620 - 85  - - 100 %   03/18/12 1610 - 86  - - 100 %   03/18/12 1600 - 94  - 107/47 mmHg -   03/18/12 1550 - 90  15  - 100 %   03/18/12 1540 - 90  - - 100 %   03/18/12 1530 - 97  18  - 98 %   03/18/12 1520 - 112  25  - 100 %   03/18/12 1515 - 97  14  - 100 %   03/18/12 1510 - 98  15  - 100 %   03/18/12 1505 - 103  18  - 100 %   03/18/12 1500 - 103  18  130/83 mmHg 100 %   03/18/12 1455 - 108  30  - 100 %   03/18/12 1450 - 101  14  - 98 %   03/18/12 1445 - 100  15  - 100 %   03/18/12 1440 - 83  17  - 100 %   03/18/12 1435 - 93  16  - 100 %   03/18/12 1430 - 89  16  - 100 %   03/18/12 1425 - 93  15  - 99 %   03/18/12 1420 - 99  - - 100 %   03/18/12 1415 - 113  - - 100 %   03/18/12 1400 - 86  - 118/66 mmHg 100 %   03/18/12 1345 - 83  - - 99 %   03/18/12 1330 - 78  - - 98 %   03/18/12  1315 - 88  17  -  98 %   03/18/12 1300 - 81  15  106/55 mmHg 98 %   03/18/12 1245 - 80  13  - 99 %   03/18/12 1230 - 85  16  - 100 %   03/18/12 1215 - 83  15  - 99 %   03/18/12 1200 - 82  15  112/60 mmHg 98 %   03/18/12 1145 - 82  14  - 99 %   03/18/12 1130 - 78  14  118/62 mmHg 100 %   03/18/12 1115 - 91  15  - 100 %   03/18/12 1110 - - - - 97 %   03/18/12 1100 - 86  16  128/73 mmHg 100 %   03/18/12 1045 - 84  14  113/64 mmHg 98 %   03/18/12 1030 - 86  14  113/68 mmHg 99 %   03/18/12 1015 - - - 118/80 mmHg -   03/18/12 1005 - 101  13  - 99 %   03/18/12 1000 - 92  15  111/71 mmHg 98 %   03/18/12 0955 - 93  16  117/68 mmHg 98 %   03/18/12 0950 - 95  12  122/70 mmHg 99 %   03/18/12 0945 - 99  15  122/73 mmHg 100 %   03/18/12 0940 - 106  13  117/68 mmHg 100 %   03/18/12 0935 - 104  17  125/77 mmHg 100 %   03/18/12 0930 - 105  20  127/82 mmHg 100 %   03/18/12 0925 - 110  23  128/85 mmHg 100 %   03/18/12 0920 - 118  27  139/79 mmHg 100 %   03/18/12 0905 - - - - 94 %       Physical Exam:  General appearance: Alert, cooperative, no distress, appears stated age  Head: Normocephalic, without obvious abnormality, atraumatic  Neck: Supple, no lymphadenopathy or thyromegaly, trachea midline  Lungs: Clear to auscultation bilaterally  Heart: Regular rate and rhythm, S1, S2 normal, no murmur, click, rub or gallop  Abdomen: Soft, non-tender and not-distended. Bowel sounds normal.   Extremities: Extremities normal, atraumatic, no cyanosis or edema  Skin: Skin color, texture, turgor normal. No rashes or lesions  Neurologic: Grossly normal and non focal, normal muscle tone and power, cranial nerves are intact, normal sensation     Intake and Output:  Current Shift:  03/23 0700 - 03/23 1859  In: 231.3 [I.V.:231.3]  Out: 250 [Urine:250]  Last three shifts:  03/21 1900 - 03/23 0659  In: 1637.5 [P.O.:500; I.V.:1137.5]  Out: 300 [Urine:300]    Lab/Data Reviewed:  BMP:   Lab Results   Component Value Date/Time    NA 141 03/18/2012  6:20 PM     K 3.7 03/18/2012  6:20 PM    CL 107 03/18/2012  6:20 PM    CO2 25 03/18/2012  6:20 PM    AGAP 9 03/18/2012  6:20 PM    GLU 87 03/18/2012  6:20 PM    BUN 13 03/18/2012  6:20 PM    CREA 1.23 03/18/2012  6:20 PM    GFRAA >60 03/18/2012  6:20 PM    GFRNA >60 03/18/2012  6:20 PM     CBC:   Lab Results   Component Value Date/Time    WBC 4.9 03/18/2012  9:40 AM    HGB 16.1* 03/18/2012  9:40 AM    HCT 46.6 03/18/2012  9:40 AM    PLT 290 03/18/2012  9:40 AM       Medications Reviewed:  Current Facility-Administered Medications   Medication Dose Route Frequency   ??? ondansetron (ZOFRAN) 4 mg/2 mL injection       ??? LORazepam (ATIVAN) injection 2 mg  2 mg IntraVENous NOW   ??? acetaminophen (TYLENOL) tablet 650 mg  650 mg Oral Q4H PRN   ??? oxyCODONE-acetaminophen (PERCOCET) 5-325 mg per tablet 1 Tab  1 Tab Oral Q4H PRN   ??? acetaminophen (TYLENOL) tablet 650 mg  650 mg Oral Q4H PRN   ??? ondansetron (ZOFRAN) injection 4 mg  4 mg IntraVENous Q4H PRN   ??? magnesium hydroxide (MILK OF MAGNESIA) oral suspension 30 mL  30 mL Oral DAILY PRN   ??? LORazepam (ATIVAN) injection 2 mg  2 mg IntraVENous Q5MIN PRN   ??? enoxaparin (LOVENOX) injection 40 mg  40 mg SubCUTAneous Q24H   ??? fosphenytoin (CEREBYX) 1,000 mg PE in 0.9% sodium chloride 100 mL IVPB  1,000 mg IntraVENous NOW   ??? LORazepam (ATIVAN) injection 4 mg  4 mg IntraVENous NOW   ??? potassium chloride 10 mEq in 100 ml IVPB  10 mEq IntraVENous Q1H   ??? influenza vaccine 2012-2013 (PF)(latex) (FLUARIX,FLUVIRIN) injection 0.5 mL  0.5 mL IntraMUSCular PRIOR TO DISCHARGE   ??? pneumococcal 23-valent (PNEUMOVAX 23) injection 0.5 mL  0.5 mL IntraMUSCular PRIOR TO DISCHARGE   ??? phenytoin ER (DILANTIN ER) ER capsule 300 mg  300 mg Oral QHS     CT of the Head: 03/18/2012  IMPRESSION:   Unremarkable noncontrast head CT. CT may be negative for infarctions in acute   setting.       Assessment/Plan   Principal Problem:   *Status epilepticus (03/18/2012)  Active Problems:     ARF (acute renal failure) (03/18/2012)     Cocaine  abuse (03/18/2012)     Nicotine dependence (03/19/2012)    1. Status Epilepticus:  - No more seizure overnight per nurse  - Patient non compliant with his anti-seizure meds  - MRI of the Brain/EEG pending  - Neurology team following - appreciated  - Transfer to medical floor    2. ARF:  - Possibly 2/2 dehydration  - Resolving  - DC IVF    3. Nicotine dep/Cocaine abuse:  - Smoking/Cocaine cessation counseling done in details  - Nicotine patch    4. Prophylaxis:  - PPI  - Lovenox        Time Spent: 20 min    Darnelle Bos, MD  March 19, 2012      Hospital Medicine  [facility]  Pager: [phone]

## 2012-03-19 NOTE — Other (Signed)
Pt pulled out PIV and refused reinsertion

## 2012-03-20 NOTE — Discharge Summary (Signed)
Discharge Summary/AMA Summary    Patient: Tony Mccarthy               Sex: male          DOA: 04/17/2012         Date of Birth:  [birth year]      Age:  21 y.o.        LOS:  LOS: 1 day                Admit Date: 04-17-2012    Date of leaving the hospital AMA: April 18, 2012    Primary Care Physician: None      Discharge Diagnoses:    Problem List as of 04/18/12  Date Reviewed: 18-Apr-2012      Codes Class Noted - Resolved    Nicotine dependence 305.1  Apr 18, 2012 - Present        *Status epilepticus 345.3  04-17-2012 - Present        ARF (acute renal failure) 584.9  Apr 17, 2012 - Present        Cocaine abuse 305.60  April 17, 2012 - Present            Labs:  Labs: Results:       Chemistry Recent Labs   Basename 04-17-2012 1820 2012-04-17 0940    GLU 87 101*    NA 141 138    K 3.7 3.5    CL 107 105    CO2 25 18*    BUN 13 17    CREA 1.23 1.39*    CA 9.0 8.8    AGAP 9 15    BUCR 11* 12    TBIL -- 0.6    GPT -- 24    AP -- 87    TP -- 7.6    ALB -- 4.6    GLOB -- 3.0    AGRAT -- 1.5      CBC w/Diff Recent Labs   [facility] Apr 17, 2012 0940    WBC 4.9    RBC 4.99    HGB 16.1*    HCT 46.6    PLT 290    GRANS 44    LYMPH 45    EOS 3      Cardiac Enzymes Recent Labs   Basename Apr 17, 2012 0940    CPK 312*    CKRMB 1.6    CKND1 0.5    TROIP --    MYO --      Coagulation Recent Labs   Basename 04/17/12 0940    PTP 14.2    INR 1.1    APTT 29.4       Liver Enzymes Recent Labs   Basename 04/17/2012 0940    TP 7.6    ALB 4.6    TBIL 0.6    AP 87    SGOT 20    GPT 24              Consults: Neurology    Treatment Team: Treatment Team: Consulting Provider: Kristie Cowman, MD; Consulting Provider: Dorna Leitz, MD    Significant Diagnostic Studies:   CT Head w/o Contrast: 04/17/2012  IMPRESSION:   Unremarkable noncontrast head CT. CT may be negative for infarctions in acute   Setting.    MRI Brain: 2012-04-18  1. No MRI findings of an acute infarct, hemorrhage, mass effect, or   herniation. No enhancing mass or lesion. No MRI findings to explain the   patient's  history of seizure.       Hospital  Course:   Tony Mccarthy is a 21 y.o. year old male with h/o of medication non compliance, seizure disorder, HTN. Who was a passenger in car with friend when he asked friend to take him to the hospital as he felt he was going to have a seizure, pt was taken to the Ed where he had 2x seizures terminated with ativan and was loaded with keppra, he had an additional 2 seizures after that.  He was initially admitted to the ICU with the diagnosis of status epilepticus. Was seen by neurology team during his hospital stay - CT of the head and MRI of the brain obtained which the results did not show any acute findings - His Keppra changed to Dilantin 300 mg po daily and he was transferred to Telemetry floor.  Per nursing staff notes, patient wanted to smoke a cigarette and decided to leave to the hospital. He was seen by on-call physician, Dr. Sharion Dove and the risks of leaving AMA discussed with him in details but he signed AMA form and left the hospital in 03/19/2012.        Visit Vitals   Item Reading   ??? BP 116/62   ??? Pulse 88   ??? Temp 98.4 ??F (36.9 ??C)   ??? Resp 18   ??? Ht 5\' 5"  (1.651 m)   ??? Wt 65.772 kg (145 lb)   ??? BMI 24.13 kg/m2   ??? SpO2 100%           Darnelle Bos, MD  March 20, 2012      Hospital Medicine  [facility]  Pager: [phone]

## 2012-03-21 LAB — HIV 1/2 AB SCREEN W RFLX CONFIRM
HIV 1/2 Interpretation: NONREACTIVE
HIV1/2 INTERPRETATION, HHIVI: NONREACTIVE

## 2012-03-21 LAB — DRUG SCREEN, URINE
AMPHETAMINES: NEGATIVE
BARBITURATES: NEGATIVE
BENZODIAZEPINES: NEGATIVE
COCAINE: NEGATIVE
METHADONE: NEGATIVE
OPIATES: NEGATIVE
PCP(PHENCYCLIDINE): NEGATIVE
THC (TH-CANNABINOL): POSITIVE — AB

## 2012-03-21 LAB — URINALYSIS W/ RFLX MICROSCOPIC
Glucose: NEGATIVE MG/DL
Leukocyte Esterase: NEGATIVE
Nitrites: NEGATIVE
Specific gravity: >1.03 — ABNORMAL HIGH (ref 1.003–1.030)
Urobilinogen: 0.2 EU/DL (ref 0.2–1.0)
pH (UA): 6 (ref 5.0–8.0)

## 2012-03-21 LAB — CBC WITH AUTOMATED DIFF
ABS. BASOPHILS: 0 10*3/uL (ref 0.0–0.06)
ABS. EOSINOPHILS: 0.2 10*3/uL (ref 0.0–0.4)
ABS. LYMPHOCYTES: 3.2 10*3/uL (ref 0.9–3.6)
ABS. MONOCYTES: 0.7 10*3/uL (ref 0.05–1.2)
ABS. NEUTROPHILS: 2.2 10*3/uL (ref 1.8–8.0)
BASOPHILS: 1 % (ref 0–2)
EOSINOPHILS: 2 % (ref 0–5)
HCT: 48.9 % — ABNORMAL HIGH (ref 36.0–48.0)
HGB: 16.9 g/dL — ABNORMAL HIGH (ref 13.0–16.0)
LYMPHOCYTES: 51 % (ref 21–52)
MCH: 32.9 PG (ref 24.0–34.0)
MCHC: 34.6 g/dL (ref 31.0–37.0)
MCV: 95.1 FL (ref 74.0–97.0)
MONOCYTES: 11 % — ABNORMAL HIGH (ref 3–10)
MPV: 10.5 FL (ref 9.2–11.8)
NEUTROPHILS: 35 % — ABNORMAL LOW (ref 40–73)
PLATELET: 305 10*3/uL (ref 135–420)
RBC: 5.14 M/uL (ref 4.70–5.50)
RDW: 13 % (ref 11.6–14.5)
WBC: 6.2 10*3/uL (ref 4.6–13.2)

## 2012-03-21 LAB — URINE MICROSCOPIC ONLY
Bacteria: NEGATIVE /HPF
RBC: 11 /HPF (ref 0–5)
WBC: NEGATIVE /HPF (ref 0–4)

## 2012-03-21 LAB — ETHYL ALCOHOL: ALCOHOL(ETHYL),SERUM: <3 MG/DL (ref 0–3)

## 2012-03-21 LAB — METABOLIC PANEL, BASIC
Anion gap: 14 mmol/L (ref 3.0–18)
BUN/Creatinine ratio: 6 — ABNORMAL LOW (ref 12–20)
BUN: 8 MG/DL (ref 7.0–18)
CO2: 22 MMOL/L (ref 21–32)
Calcium: 9 MG/DL (ref 8.5–10.1)
Chloride: 104 MMOL/L (ref 100–108)
Creatinine: 1.35 MG/DL — ABNORMAL HIGH (ref 0.6–1.3)
GFR est AA: >60 mL/min/{1.73_m2} (ref >60)
GFR est non-AA: >60 mL/min/{1.73_m2} (ref >60)
Glucose: 116 MG/DL — ABNORMAL HIGH (ref 74–99)
Potassium: 3.7 MMOL/L (ref 3.5–5.5)
Sodium: 140 MMOL/L (ref 136–145)

## 2012-03-21 LAB — CARDIAC PANEL,(CK, CKMB & TROPONIN)
CK - MB: 1 ng/ml (ref 0.5–3.6)
CK-MB Index: 0.5 % (ref 0.0–4.0)
CK: 220 U/L (ref 39–308)
Troponin-I, QT: <0.02 NG/ML (ref 0.0–0.045)

## 2012-03-21 LAB — PHENYTOIN: Phenytoin: 14.8 ug/mL (ref 10.0–20.0)

## 2012-03-21 MED ORDER — PHENYTOIN SODIUM EXTENDED 100 MG CAP
100 mg | ORAL_CAPSULE | Freq: Every day | ORAL | Status: DC
Start: 2012-03-21 — End: 2013-05-23

## 2012-03-21 MED ORDER — LORAZEPAM 2 MG/ML IJ SOLN
2 mg/mL | INTRAMUSCULAR | Status: DC
Start: 2012-03-21 — End: 2012-03-21

## 2012-03-21 NOTE — ED Notes (Signed)
Life coach with patient

## 2012-03-21 NOTE — Discharge Instructions (Signed)
After talking with patient he is currently homeless with mate and I gave them information to shelters. I made him an appt to see Dr. Gwenith Daily on 03/29/12 at 2 pm 4 th floor [facility]. I also gave him [employer] information to help with medication assistance. MD made aware. Juliene Pina, Life Coach

## 2012-03-21 NOTE — ED Notes (Signed)
Foley cath placed by L [place] RN

## 2012-03-21 NOTE — ED Notes (Signed)
Per triage nurse, pt actively seizing in his car. Pt brought in to [facility], seized x 2 after getting him on the stretcher. PIV placed by MD, pt medicated per orders. Pt now postictal.

## 2012-03-21 NOTE — Discharge Instructions (Signed)
Talked with patient as he was discharge 03/19/12 signed out AMA. Patient stated he did not go see PCP as he was not home longer enough to see any MD. He is requesting to have foley cath removed and wanting to call home. Nurse made aware. He stated he does not want to talk about a PCP right now. Will try later. Juliene Pina, Life Coach

## 2012-03-21 NOTE — ED Provider Notes (Signed)
HPI Comments: Tony Mccarthy is a 21 y.o. Male with PMH including seizures and polysubstance abuse who was brought to the ED by his friend s/p a seizure. Per triage nurse, the patient was actively seizing in his car and was brought to the main treatment area by the triage nurse and security. Patient then seized twice upon being placed on the hospital stretcher. He was just seen in the ED 3 days ago for the same and was admitted. Patient is postictal and no further hx is available at this time.       The history is limited by the condition of the patient.        Past Medical History   Diagnosis Date   ??? Seizures    ??? Cocaine abuse    ??? Marijuana abuse         Past Surgical History   Procedure Date   ??? Hx orthopaedic      back surgery 2012         No family history on file.     History     Social History   ??? Marital Status: SINGLE     Spouse Name: N/A     Number of Children: N/A   ??? Years of Education: N/A     Occupational History   ??? Not on file.     Social History Main Topics   ??? Smoking status: Current Everyday Smoker -- 0.5 packs/day   ??? Smokeless tobacco: Not on file   ??? Alcohol Use: 0.5 oz/week     1 Shots of liquor per week   ??? Drug Use: Yes     Special: Marijuana, Cocaine   ??? Sexually Active:      Other Topics Concern   ??? Not on file     Social History Narrative   ??? No narrative on file                  ALLERGIES: Pcn      Review of Systems   Unable to perform ROS: Mental status change   (POSTICTAL S/P SEIZURE X2 IN THE ED)    Filed Vitals:    03/21/12 1022   BP: 125/76   Pulse: 101   Temp: 97.3 ??F (36.3 ??C)   Resp: 16   SpO2: 98%            Physical Exam   Nursing note and vitals reviewed.  Constitutional: He is oriented to person, place, and time. He appears well-developed and well-nourished. No distress.        21 year old African American male in severe distress.  Actively seizing.  Not post-ictal   HENT:   Head: Normocephalic and atraumatic.   Right Ear: External ear normal.   Left Ear: External ear normal.    Nose: Nose normal.   Mouth/Throat: Oropharynx is clear and moist. No oropharyngeal exudate.        Tongue not bitten   Eyes: Conjunctivae and EOM are normal. Pupils are equal, round, and reactive to light. Right eye exhibits no discharge. Left eye exhibits no discharge. No scleral icterus.   Neck: Normal range of motion. Neck supple. No JVD present. No tracheal deviation present. No thyromegaly present.   Cardiovascular: Normal rate, regular rhythm, normal heart sounds and intact distal pulses.  Exam reveals no gallop and no friction rub.    No murmur heard.  Pulmonary/Chest: Effort normal and breath sounds normal. No stridor. No respiratory distress. He has no  wheezes. He has no rales. He exhibits no tenderness.   Abdominal: Soft. Bowel sounds are normal. He exhibits no distension and no mass. There is no tenderness. There is no rebound and no guarding.   Genitourinary: Penis normal.   Musculoskeletal: Normal range of motion. He exhibits no edema and no tenderness.   Lymphadenopathy:     He has no cervical adenopathy.   Neurological: He is alert and oriented to person, place, and time. He has normal reflexes. No cranial nerve deficit. He exhibits normal muscle tone. Coordination normal.   Skin: Skin is warm and dry. No rash noted. He is not diaphoretic. No erythema. No pallor.   Psychiatric:        Unable to assess due to post-ictal state.        MDM     Differential Diagnosis; Clinical Impression; Plan:     Differential includes: Recurrent Seizure, Medication non-compliance, Drug abuse, alcohol intoxication.  Amount and/or Complexity of Data Reviewed:   Clinical lab tests:  Ordered and reviewed  Tests in the medicine section of the CPT??:  Ordered and reviewed   Decide to obtain previous medical records or to obtain history from someone other than the patient:  Yes   Obtain history from someone other than the patient:  Yes   Review and summarize past medical records:  Yes  Risk of Significant Complications,  Morbidity, and/or Mortality:   Presenting problems:  High  Diagnostic procedures:  High  Management options:  High  Critical Care:   Total time providing critical care:  30-74 minutes      Procedures    PROGRESS NOTES  9:53 AM: Patient brought to the main treatment area by the triage nurse and security after being found actively seizing in his car. ER physician, Llana Aliment, DO at the bedside.   9:54 AM: ER physician still at bedside. Patient began seizing again.    9:56 AM: ER physician remains at bedside. Patient began seizing again. Ativan 4mg  IV administered.   10:46 AM: Patient remains postictal. Review of his past medical records, the patient has a hx of cocaine abuse. Records also reveal that he was admitted 3 days ago but left AMA 2 days ago voluntarily. No family has arrived at this time.     3:16 PM  PROGRESS NOTE  Upon discharge, the patient's medications were changed to dilantin 300 mg PO daily.  Pt is feeling much better currently in the ED and is currently not having any seizure activity after given ativan 4 mg in the ED.        Life coach requested to assist pt with getting his medications.    3:51 PM  PROGRESS NOTE  Marylene Land, Life coach, talking with the patient currently in the ED.     4:24 PM  CONSULT  Dr. Llana Aliment, DO discussed patient with Marylene Land, life coach. Marylene Land made patient an appt to see Dr. Gwenith Daily on 03/29/12 at 2 pm 4 th floor [facility]. and gave him [employer] information to help with medication assistance    4:30 PM  PROGRESS NOTE  PT states that he did not receive any prescriptions on his last visit.  The pt's chart reveals that he was prescribed medications.      4:36 PM  PROGRESS NOTE  PT observed in the ED for 5 hrs without any seizure activity.    Diagnosis: Recurrent Seizures  1. Seizure    2. Marijuana use  Disposition: Home    Follow-up Information     Follow up With Details Comments Contact Info    Rosalio Loud, MD in 8 days  [address]  [address]  [place] [address]  [phone]            Patient's Medications   Start Taking    PHENYTOIN ER (DILANTIN EXTENDED) 100 MG ER CAPSULE    Take 1 Cap by mouth daily. Take 3 tablets PO daily   Continue Taking    PHENYTOIN ER (DILANTIN EXTENDED) 100 MG ER CAPSULE    Take 3 Caps by mouth daily.   These Medications have changed    No medications on file   Stop Taking    No medications on file         SCRIBE ATTESTATION STATMENT  Provider documentation is written by Dulce Sellar. [place], acting as Neurosurgeon for [employer], DO.    I, Llana Aliment, DO, have reviewed the information recorded by the Scribe and agree with its contents.     3:17 PM  Provider documentation is written by Avelina Laine acting as a scribe for Dr. Llana Aliment, DO.  I have reviewed the information documented by the Scribe and agree with its contents:  Dr. Llana Aliment, DO.

## 2012-03-21 NOTE — ED Notes (Signed)
Patient armband removed and shredded    Patient stated understanding of discharge instructions. Patient received one prescription(s) Patient told not to drive with medication. Patient was ambulatory upon discharge. Patient was in stable condition. Patient was accompanies with family member.

## 2012-03-21 NOTE — ED Notes (Signed)
Received report from nurse, patient resting in the bed.

## 2013-05-23 LAB — URINE MICROSCOPIC ONLY
Bacteria: NEGATIVE /hpf
Epithelial cells: NEGATIVE /lpf (ref 0–5)
RBC: 0 /hpf (ref 0–5)
WBC: 0 /hpf (ref 0–4)

## 2013-05-23 LAB — METABOLIC PANEL, COMPREHENSIVE
A-G Ratio: 1.4 (ref 0.8–1.7)
ALT (SGPT): 27 U/L (ref 12.0–78.0)
AST (SGOT): 20 U/L (ref 15–37)
Albumin: 4.4 g/dL (ref 3.4–5.0)
Alk. phosphatase: 78 U/L (ref 45–117)
Anion gap: 9 mmol/L (ref 3.0–18)
BUN/Creatinine ratio: 11 — ABNORMAL LOW (ref 12–20)
BUN: 16 MG/DL (ref 7.0–18)
Bilirubin, total: 0.3 MG/DL (ref 0.2–1.0)
CO2: 23 mmol/L (ref 21–32)
Calcium: 9.2 MG/DL (ref 8.5–10.1)
Chloride: 105 mmol/L (ref 100–108)
Creatinine: 1.42 MG/DL — ABNORMAL HIGH (ref 0.6–1.3)
GFR est AA: >60 mL/min/{1.73_m2} (ref >60)
GFR est non-AA: >60 mL/min/{1.73_m2} (ref >60)
Globulin: 3.1 g/dL (ref 2.0–4.0)
Glucose: 95 mg/dL (ref 74–99)
Potassium: 4 mmol/L (ref 3.5–5.5)
Protein, total: 7.5 g/dL (ref 6.4–8.2)
Sodium: 137 mmol/L (ref 136–145)

## 2013-05-23 LAB — CBC WITH AUTOMATED DIFF
ABS. BASOPHILS: 0 10*3/uL (ref 0.0–0.06)
ABS. EOSINOPHILS: 0.1 10*3/uL (ref 0.0–0.4)
ABS. LYMPHOCYTES: 2 10*3/uL (ref 0.9–3.6)
ABS. MONOCYTES: 0.5 10*3/uL (ref 0.05–1.2)
ABS. NEUTROPHILS: 2.8 10*3/uL (ref 1.8–8.0)
BASOPHILS: 1 % (ref 0–2)
EOSINOPHILS: 2 % (ref 0–5)
HCT: 45.4 % (ref 36.0–48.0)
HGB: 15.8 g/dL (ref 13.0–16.0)
LYMPHOCYTES: 37 % (ref 21–52)
MCH: 32.2 PG (ref 24.0–34.0)
MCHC: 34.8 g/dL (ref 31.0–37.0)
MCV: 92.5 FL (ref 74.0–97.0)
MONOCYTES: 10 % (ref 3–10)
MPV: 9.9 FL (ref 9.2–11.8)
NEUTROPHILS: 50 % (ref 40–73)
PLATELET: 294 10*3/uL (ref 135–420)
RBC: 4.91 M/uL (ref 4.70–5.50)
RDW: 13.2 % (ref 11.6–14.5)
WBC: 5.5 10*3/uL (ref 4.6–13.2)

## 2013-05-23 LAB — ETHYL ALCOHOL: ALCOHOL(ETHYL),SERUM: <3 MG/DL (ref 0–3)

## 2013-05-23 LAB — DRUG SCREEN, URINE
AMPHETAMINES: NEGATIVE
BARBITURATES: NEGATIVE
BENZODIAZEPINES: NEGATIVE
COCAINE: NEGATIVE
METHADONE: NEGATIVE
OPIATES: NEGATIVE
PCP(PHENCYCLIDINE): NEGATIVE
THC (TH-CANNABINOL): POSITIVE — AB

## 2013-05-23 LAB — GLUCOSE, POC: Glucose (POC): 99 mg/dL (ref 70–110)

## 2013-05-23 LAB — URINALYSIS W/ RFLX MICROSCOPIC
Bilirubin: NEGATIVE
Glucose: NEGATIVE mg/dL
Leukocyte Esterase: NEGATIVE
Nitrites: NEGATIVE
Protein: NEGATIVE mg/dL
Specific gravity: >1.03 — ABNORMAL HIGH (ref 1.003–1.030)
Urobilinogen: 0.2 EU/dL (ref 0.2–1.0)
pH (UA): 6 (ref 5.0–8.0)

## 2013-05-23 MED ADMIN — LORazepam (ATIVAN) 2 mg/mL injection: INTRAVENOUS | @ 13:00:00 | NDC 00409677802

## 2013-05-23 MED FILL — LORAZEPAM 2 MG/ML IJ SOLN: 2 mg/mL | INTRAMUSCULAR | Qty: 1

## 2013-05-23 NOTE — ED Notes (Signed)
Pt had another pseudoseizure witnessed by this Clinical research associate.

## 2013-05-23 NOTE — ED Notes (Signed)
8 seizures today

## 2013-05-23 NOTE — ED Provider Notes (Signed)
HPI Comments: 22yo male with PMH seizures, HTN, cocaine abuse and marijuana abuse was brought to the ED via EMS c/o seizures. Per EMS patient had 6 seizures at home witnessed by a roommate and 2 seizures witnessed by EMS. EMS administered 2mg  Ativan en route to ED. EMS states patient was taken off of seizure medication because seizures are related to stress. Upon arrival to the ED patient is able to talk in a whisper. He states he is having lower back pain but denies any other symptoms. He states this is his first seizure in a year. He states he drinks ETOH but none in the last 24 hours. He denies drug and tobacco use. He expresses no other complaints at this time.     Patient is a 22 y.o. male presenting with seizures. The history is provided by the patient and the EMS personnel.   Seizure   Pertinent negatives include no confusion, no headaches, no visual disturbance, no sore throat, no chest pain, no cough, no nausea, no vomiting and no diarrhea.   He reports no chest pain, no confusion, no visual disturbance, no diarrhea, no vomiting, no headaches, no sore throat and no cough.        Past Medical History   Diagnosis Date   ??? Seizures      epilepsy   ??? Hypertension    ??? Seizures    ??? Cocaine abuse    ??? Marijuana abuse         Past Surgical History   Procedure Laterality Date   ??? Hx tonsillectomy     ??? Hx tonsillectomy     ??? Hx orthopaedic       back surgery 2012         History reviewed. No pertinent family history.     History     Social History   ??? Marital Status: SINGLE     Spouse Name: N/A     Number of Children: N/A   ??? Years of Education: N/A     Occupational History   ??? Not on file.     Social History Main Topics   ??? Smoking status: Current Every Day Smoker -- 0.50 packs/day   ??? Smokeless tobacco: Not on file   ??? Alcohol Use: 0.5 oz/week     1 Shots of liquor per week   ??? Drug Use: Yes     Special: Marijuana, Cocaine   ??? Sexually Active:      Other Topics Concern   ??? Not on file     Social History  Narrative    ** Merged History Encounter **                       ALLERGIES: Amoxicillin and Pcn      Review of Systems   Constitutional: Negative.  Negative for fever, chills and diaphoresis.   HENT: Negative.  Negative for ear pain, congestion, sore throat, trouble swallowing and neck pain.    Eyes: Negative.  Negative for photophobia, pain, redness and visual disturbance.   Respiratory: Negative.  Negative for cough, chest tightness, shortness of breath and wheezing.    Cardiovascular: Negative.  Negative for chest pain and palpitations.   Gastrointestinal: Negative.  Negative for nausea, vomiting, abdominal pain, diarrhea and blood in stool.   Genitourinary: Negative for dysuria and frequency.   Musculoskeletal: Positive for back pain. Negative for joint swelling.   Skin: Negative.    Neurological: Positive for seizures. Negative  for syncope and headaches.   Psychiatric/Behavioral: Negative.  Negative for behavioral problems and confusion. The patient is not nervous/anxious.    All other systems reviewed and are negative.        Filed Vitals:    05/23/13 0833   BP: 145/85   Pulse: 89   Temp: 98 ??F (36.7 ??C)   Resp: 18   SpO2: 100%            Physical Exam   Nursing note and vitals reviewed.  Constitutional: He appears well-developed and well-nourished. He appears distressed.   Mild distress. After nasal Q-tip patient is able to talk in a whisper.   HENT:   Head: Atraumatic.   Right Ear: External ear normal.   Left Ear: External ear normal.   Mouth/Throat: Oropharynx is clear and moist.   No tongue laceration.   Eyes: Conjunctivae and EOM are normal.   Neck: Normal range of motion. Neck supple. No tracheal deviation present.   Cardiovascular: Normal rate, regular rhythm and normal heart sounds.    Pulmonary/Chest: Effort normal and breath sounds normal. No respiratory distress. He has no wheezes. He has no rales. He exhibits no tenderness.   Abdominal: Soft. Bowel sounds are normal. There is no tenderness. There  is no rebound and no guarding.   Genitourinary:   No urinary incontinence.   Musculoskeletal: Normal range of motion. He exhibits no edema and no tenderness.   Lymphadenopathy:     He has no cervical adenopathy.   Neurological: He is alert. No cranial nerve deficit. Coordination normal.   Skin: Skin is warm and dry. No rash noted. He is not diaphoretic.   Psychiatric: He has a normal mood and affect. His behavior is normal.        MDM     Differential Diagnosis; Clinical Impression; Plan:     DC 02/2012:  Hospital Course:    Leiby Pigeon is a 22 y.o. year old male with h/o of medication non compliance, seizure disorder, HTN. Who was a passenger in car with friend when he asked friend to take him to the hospital as he felt he was going to have a seizure, pt was taken to the Ed where he had 2x seizures terminated with ativan and was loaded with keppra, he had an additional 2 seizures after that.  He was initially admitted to the ICU with the diagnosis of status epilepticus. Was seen by neurology team during his hospital stay - CT of the head and MRI of the brain obtained which the results did not show any acute findings - His Keppra changed to Dilantin 300 mg po daily and he was transferred to Telemetry floor.  Per nursing staff notes, patient wanted to smoke a cigarette and decided to leave to the hospital. He was seen by on-call physician, Dr. Sharion Dove and the risks of leaving AMA discussed with him in details but he signed AMA form and left the hospital in 03/19/2012.  ---------------------------------  SEEMINGLY HAS BOTH SZ AND PSEUDOSZ, LOOKED PSEUDO HERE, MORE REAL TO EMS AT SCENE, NOT POSTICTAL. KNOWS HAS SZ, ONLY SX LOW BACK SORE BUT NEG EXAM OTHERWISE. CLEARLY HAD PSEUDOSZ DURING EXAM WITHOUT POSTICTAL. WHO KNOWS ABOUT BEFORE. DESPITE THE DC SUMM NOTES ABOVE, HIS NEUROLOGIST TOOK OFF SZ MEDS AND TOLD HIM THEY WERE STRESS RELATED.  PLAN: LAB, CT, NEURO CALL. ALREADY HAD 3-4 IV ATIVAN BEFORE I SAY.  10:58 AM CHARTS  FRON SC SHOW PSEUDOSZ, PT HAD ANOTHER HERE, BROTHER SAYS LOTS  OF STRESS, "I KNOW THESE ARE FROM STRESS", PT AGREES. STABLE FOR DC          Amount and/or Complexity of Data Reviewed:   Clinical lab tests:  Ordered and reviewed  Tests in the radiology section of CPT??:  Ordered and reviewed  Tests in the medicine section of the CPT??:  Reviewed   Decide to obtain previous medical records or to obtain history from someone other than the patient:  Yes   Obtain history from someone other than the patient:  Yes   Review and summarize past medical records:  Yes   Independant visualization of image, tracing, or specimen:  Yes  Risk of Significant Complications, Morbidity, and/or Mortality:   Presenting problems:  Moderate  Diagnostic procedures:  Moderate  Management options:  Moderate  Progress:   Patient progress:  Improved      Procedures    -------------------------------------------------------------------------------------------------------------------  PROGRESS NOTES:  8:37 AM:  Felix Ahmadi, MD discussed treatment plan with the patient.   9:10 AM: Patient's friend is at the bedside to provide further information. Patient recently moved from [place] to [place] and does not have neurology follow up in [place]. He began having seizures again in March 2013 in [place]. He was last seen at a hospital in [place], [place] within the last year, told his seizures were related to stress and taken off his seizure medications. He has had an EEG at [facility] about 2 years ago. He states he was followed by Dr. Kathi Ludwig, neurosurgeon in [facility] at that time.   10:30 AM: Felix Ahmadi, MD reviewed records from [place]. Patient had a normal EEG in September 2013. He was thought to be having pseudoseizures. Full discharge summary was scanned into patient's chart. Admission for seizures eventually diagnosed as psychogenic.   10:37 AM: Felix Ahmadi, MD reviewed lab and CT results with the patient. Discussed  records from [place]. The brother talked about the patient being under stress moving back from [facility]. Patient agrees that he is under stress. He has been to [facility] before, will refer him back there. Will start patient on Ativan, mostly for stress not seizures.  Discussed diagnosis, prescriptions and follow up instructions with the patient. Answered patient's questions. Patient is stable and ready for discharge.     CONSULTATIONS:  None    EKG INTERPRETATIONS:  None    RADIOLOGY RESULTS:  CT HEAD WO CONT (Final result)  Result time: 05/23/13 09:58:52      Final result by Raynelle Chary, MD (05/23/13 09:58:52)      Impression:    IMPRESSION:    1. No acute intracranial hemorrhage, mass effect, midline shift, or  herniation. No definite CT evidence of acute cortical infarct is seen. Please  note that noncontrast head CT may be normal in early acute infarct.          Narrative:    CT Head    History: Seizures    Comparison: CT head 03/18/12    Technique: Multiple axial CT images of the head were obtained extending from  the skull base to the vertex. Additional coronal and sagittal CT reformations  were performed.    Findings:    The ventricles and sulci are normal in their size and configuration. There  is no evidence of acute intracranial hemorrhage, mass effect, midline shift, or  herniation. No definite CT evidence of acute cortical infarct is seen. The  gray-white matter differentiation is within  normal limits. The visualized  orbits are unremarkable. The visualized paranasal sinuses and mastoid air  cells are clear. No fracture is seen.         RESULTS:   Recent Results (from the past 12 hour(s))   GLUCOSE, POC    Collection Time     05/23/13  8:30 AM       Result Value Range    Glucose (POC) 99  70 - 110 mg/dL   CBC WITH AUTOMATED DIFF    Collection Time     05/23/13  8:35 AM       Result Value Range    WBC 5.5  4.6 - 13.2 K/uL    RBC 4.91  4.70 - 5.50 M/uL    HGB 15.8  13.0 - 16.0 g/dL    HCT 16.1   09.6 - 04.5 %    MCV 92.5  74.0 - 97.0 FL    MCH 32.2  24.0 - 34.0 PG    MCHC 34.8  31.0 - 37.0 g/dL    RDW 40.9  81.1 - 91.4 %    PLATELET 294  135 - 420 K/uL    MPV 9.9  9.2 - 11.8 FL    NEUTROPHILS 50  40 - 73 %    LYMPHOCYTES 37  21 - 52 %    MONOCYTES 10  3 - 10 %    EOSINOPHILS 2  0 - 5 %    BASOPHILS 1  0 - 2 %    ABS. NEUTROPHILS 2.8  1.8 - 8.0 K/UL    ABS. LYMPHOCYTES 2.0  0.9 - 3.6 K/UL    ABS. MONOCYTES 0.5  0.05 - 1.2 K/UL    ABS. EOSINOPHILS 0.1  0.0 - 0.4 K/UL    ABS. BASOPHILS 0.0  0.0 - 0.06 K/UL    DF AUTOMATED     METABOLIC PANEL, COMPREHENSIVE    Collection Time     05/23/13  8:35 AM       Result Value Range    Sodium 137  136 - 145 mmol/L    Potassium 4.0  3.5 - 5.5 mmol/L    Chloride 105  100 - 108 mmol/L    CO2 23  21 - 32 mmol/L    Anion gap 9  3.0 - 18 mmol/L    Glucose 95  74 - 99 mg/dL    BUN 16  7.0 - 18 MG/DL    Creatinine 7.82 (*) 0.6 - 1.3 MG/DL    BUN/Creatinine ratio 11 (*) 12 - 20      GFR est AA >60  >60 ml/min/1.64m2    GFR est non-AA >60  >60 ml/min/1.77m2    Calcium 9.2  8.5 - 10.1 MG/DL    Bilirubin, total 0.3  0.2 - 1.0 MG/DL    ALT 27  95.6 - 21.3 U/L    AST 20  15 - 37 U/L    Alk. phosphatase 78  45 - 117 U/L    Protein, total 7.5  6.4 - 8.2 g/dL    Albumin 4.4  3.4 - 5.0 g/dL    Globulin 3.1  2.0 - 4.0 g/dL    A-G Ratio 1.4  0.8 - 1.7     ETHYL ALCOHOL    Collection Time     05/23/13  8:35 AM       Result Value Range    ALCOHOL(ETHYL),SERUM <3  0 - 3 MG/DL   URINALYSIS W/ RFLX MICROSCOPIC    Collection  Time     05/23/13  8:45 AM       Result Value Range    Color YELLOW      Appearance CLEAR      Specific gravity >1.030 (*) 1.003 - 1.030    pH (UA) 6.0  5.0 - 8.0      Protein NEGATIVE   NEG mg/dL    Glucose NEGATIVE   NEG mg/dL    Ketone TRACE (*) NEG mg/dL    Bilirubin NEGATIVE   NEG      Blood TRACE (*) NEG      Urobilinogen 0.2  0.2 - 1.0 EU/dL    Nitrites NEGATIVE   NEG      Leukocyte Esterase NEGATIVE   NEG     DRUG SCREEN, URINE    Collection Time     05/23/13  8:45 AM        Result Value Range    BENZODIAZEPINE NEGATIVE   NEG      BARBITURATES NEGATIVE   NEG      THC (TH-CANNABINOL) POSITIVE (*) NEG      OPIATES NEGATIVE   NEG      PCP(PHENCYCLIDINE) NEGATIVE   NEG      COCAINE NEGATIVE   NEG      AMPHETAMINE NEGATIVE   NEG      METHADONE NEGATIVE       HDSCOM (NOTE)     URINE MICROSCOPIC ONLY    Collection Time     05/23/13  8:45 AM       Result Value Range    WBC 0  0 - 4 /hpf    RBC 0  0 - 5 /hpf    Epithelial cells NEGATIVE   0 - 5 /lpf    Bacteria NEGATIVE   NEG /hpf    Mucus 3+ (*) NEG /lpf        Disposition  Diagnosis:   1. Pseudoseizure    2. Elevated blood pressure    3. HIV (human immunodeficiency virus infection)    4. Stress reaction          Disposition: Discharged.    Follow-up Information    Follow up With Details Comments Contact Info    [facility] Schedule an appointment as soon as possible for a visit schedule an appointment for further evaluation and treatment. Have your blood pressure rechecked. [address]  [place] [address]  [phone]    [facility] EMERGENCY DEPT  If symptoms worsen [address]  [place] [address]  [phone]          Current Discharge Medication List      START taking these medications    Details   LORazepam (ATIVAN) 1 mg tablet Take 1 Tab by mouth every eight (8) hours as needed for Anxiety.  Qty: 60 Tab, Refills: 0         CONTINUE these medications which have NOT CHANGED    Details   divalproex DR (DEPAKOTE) 500 mg tablet Take 500 mg by mouth three (3) times daily.        !! metoprolol (LOPRESSOR) 25 mg tablet Take 25 mg by mouth two (2) times a day.        !! metoprolol (LOPRESSOR) 50 mg tablet Take 0.5 Tabs by mouth two (2) times a day.  Qty: 60 Tab, Refills: 1      chlorpheniramine-HYDROcodone (TUSSIONEX PENNKINETIC ER) 8-10 mg/5 mL suspension Take 5 mL by mouth every  twelve (12) hours as needed for Cough.  Qty: 60 mL, Refills: 0       !! - Potential duplicate medications found. Please discuss with  provider.      STOP taking these medications       phenytoin ER (DILANTIN EXTENDED) 100 mg ER capsule Comments:   Reason for Stopping:         phenytoin ER (DILANTIN EXTENDED) 100 mg ER capsule Comments:   Reason for Stopping:         phenytoin ER (DILANTIN EXTENDED) 100 mg ER capsule Comments:   Reason for Stopping:                Education officer, museum documentation written [id number] Dalmida, scribing for and in the presence of Felix Ahmadi, MD ED Provider. 8:47 AM  -------------------------------------------------------------------------------------------------------------------   Provider Attestation:   I personally performed the services described in the documentation, and if a scribe was used, I reviewed the documentation, as recorded by the scribe in my presence, and it accurately and completely records my words and actions.  Felix Ahmadi, MD 10:59 AM 05/23/2013   IN ADDITION, I HAVE REVIEWED ANY PAST-FAMILY-SOCIAL HX (AS PER "[facility]"), ALL PTs WITH ANY ELEVATED BP READINGS HAVE BEEN TOLD TO FOLLOW WITH THEIR PCP, ALL PTS TOLD MAKE SURE LEAVE PROPER PHONE NUMBERS, AND ALL CRITICAL CARE TIMES GIVEN (IF APPLICAPLE) DO NOT INCLUDE ANY PROCEDURE TIME IN THE ESTIMATE.  In accordance with [facility]) for the Centers for Medicare and Medicaid Services this patient has been advised to follow up with their primary care physician tomorrow. If unable to follow up with their primary care physician tomorrow, the patient should call [facility] at [phone] for an immediate appointment.

## 2019-02-14 ENCOUNTER — Emergency Department (HOSPITAL_BASED_OUTPATIENT_CLINIC_OR_DEPARTMENT_OTHER)
Admission: EM | Admit: 2019-02-14 | Discharge: 2019-02-14 | Disposition: A | Discharge status: Home / Self Care | Payer: Self-pay | Attending: Emergency Medicine | Admitting: Emergency Medicine

## 2019-02-14 ENCOUNTER — Other Ambulatory Visit: Payer: Self-pay

## 2019-02-14 ENCOUNTER — Emergency Department (HOSPITAL_BASED_OUTPATIENT_CLINIC_OR_DEPARTMENT_OTHER): Payer: Self-pay

## 2019-02-14 ENCOUNTER — Encounter (HOSPITAL_BASED_OUTPATIENT_CLINIC_OR_DEPARTMENT_OTHER): Payer: Self-pay | Admitting: *Deleted

## 2019-02-14 DIAGNOSIS — I509 Heart failure, unspecified: Secondary | ICD-10-CM | POA: Insufficient documentation

## 2019-02-14 DIAGNOSIS — R0789 Other chest pain: Secondary | ICD-10-CM | POA: Insufficient documentation

## 2019-02-14 DIAGNOSIS — K0889 Other specified disorders of teeth and supporting structures: Secondary | ICD-10-CM | POA: Insufficient documentation

## 2019-02-14 DIAGNOSIS — F17210 Nicotine dependence, cigarettes, uncomplicated: Secondary | ICD-10-CM | POA: Insufficient documentation

## 2019-02-14 HISTORY — DX: Unspecified convulsions: R56.9

## 2019-02-14 HISTORY — DX: Bipolar disorder, unspecified: F31.9

## 2019-02-14 HISTORY — DX: Human immunodeficiency virus (HIV) disease: B20

## 2019-02-14 HISTORY — DX: Reserved for concepts with insufficient information to code with codable children: IMO0002

## 2019-02-14 HISTORY — DX: Heart failure, unspecified: I50.9

## 2019-02-14 HISTORY — DX: Systemic lupus erythematosus, unspecified: M32.9

## 2019-02-14 HISTORY — DX: Asymptomatic human immunodeficiency virus (hiv) infection status: Z21

## 2019-02-14 LAB — BASIC METABOLIC PANEL
Anion gap: 9 (ref 5–15)
BUN: 14 mg/dL (ref 6–20)
CO2: 23 mmol/L (ref 22–32)
Calcium: 9.4 mg/dL (ref 8.9–10.3)
Chloride: 105 mmol/L (ref 98–111)
Creatinine, Ser: 1.18 mg/dL (ref 0.61–1.24)
GFR calc Af Amer: >60 mL/min (ref >60)
GFR calc non Af Amer: >60 mL/min (ref >60)
Glucose, Bld: 105 mg/dL — ABNORMAL HIGH (ref 70–99)
Potassium: 3.6 mmol/L (ref 3.5–5.1)
Sodium: 137 mmol/L (ref 135–145)

## 2019-02-14 LAB — D-DIMER, QUANTITATIVE: D-Dimer, Quant: <0.27 ug/mL-FEU (ref 0.00–0.50)

## 2019-02-14 LAB — CBC
HCT: 49.4 % (ref 39.0–52.0)
Hemoglobin: 16.9 g/dL (ref 13.0–17.0)
MCH: 31.6 pg (ref 26.0–34.0)
MCHC: 34.2 g/dL (ref 30.0–36.0)
MCV: 92.5 fL (ref 80.0–100.0)
Platelets: 320 10*3/uL (ref 150–400)
RBC: 5.34 MIL/uL (ref 4.22–5.81)
RDW: 12.7 % (ref 11.5–15.5)
WBC: 5.5 10*3/uL (ref 4.0–10.5)
nRBC: 0 % (ref 0.0–0.2)

## 2019-02-14 LAB — BRAIN NATRIURETIC PEPTIDE: B Natriuretic Peptide: 15.3 pg/mL (ref 0.0–100.0)

## 2019-02-14 LAB — TROPONIN I: Troponin I: <0.03 ng/mL (ref <0.03)

## 2019-02-14 LAB — VALPROIC ACID LEVEL: Valproic Acid Lvl: <10 ug/mL — ABNORMAL LOW (ref 50.0–100.0)

## 2019-02-14 MED ORDER — LISINOPRIL 10 MG PO TABS: 10.0000 mg | ORAL_TABLET | Freq: Every day | ORAL | 0 refills | Status: AC

## 2019-02-14 MED ORDER — KETOROLAC TROMETHAMINE 60 MG/2ML IM SOLN
30.0000 mg | Freq: Once | INTRAMUSCULAR | Status: AC
  Administered 2019-02-14: 30 mg via INTRAMUSCULAR
  Filled 2019-02-14: qty 2

## 2019-02-14 MED ORDER — NAPROXEN 500 MG PO TABS: 500.0000 mg | ORAL_TABLET | Freq: Two times a day (BID) | ORAL | 0 refills | Status: AC

## 2019-02-14 MED ORDER — DIVALPROEX SODIUM 500 MG PO DR TAB: 500.0000 mg | DELAYED_RELEASE_TABLET | Freq: Three times a day (TID) | ORAL | 0 refills | Status: DC

## 2019-02-14 MED ORDER — FUROSEMIDE 20 MG PO TABS: 20.0000 mg | ORAL_TABLET | Freq: Every day | ORAL | 0 refills | Status: AC

## 2019-02-14 NOTE — Discharge Instructions (Signed)
Thank you for allowing me to care for you today. Please return to the emergency department if you have new or worsening symptoms. Take your medications as instructed.  ° °

## 2019-02-14 NOTE — ED Provider Notes (Signed)
[facility] EMERGENCY DEPARTMENT Provider Note   CSN: [id number] Arrival date & time: 02/14/19  1349    History   Chief Complaint Chief Complaint  Patient presents with  . Numbness    HPI Scott Stewart is a 28 y.o. male.     Patient is a 28 year old African-American male with past medical history of HIV, CHF, lupus, seizure disorder who presents the emergency department for multiple complaints.  Patient reports that he has been having left-sided chest pain off and on for a couple of weeks.  He reports that the only medication he has been taking recently is his Depakote for his seizures but has not had his other medication in several months.  He reports that [place] care [place] in [place] and [place] to [place] and has not had a new primary care doctor.  Reports he was previously taking Lasix, lisinopril, prednisone.  He reports that the chest pain is sharp and radiates into his left arm.  It comes and goes but is worse with stress and worse with prolonged activity. It is not pleuritic.  Reports that he also has shortness of breath off and on.  Reports that when he goes to the second floor of his house he tries to stay there because he is short of breath and does not want to go back downstairs.  Reports intermittent swelling in his bilateral lower extremities.  Also complains that he has pain in his left lower molar.  Reports that his tooth chipped several weeks ago.  Reports he made an appointment with dentist but has not seen them yet.  Denies any trouble breathing or swallowing, fever, chills, nausea.  Also reports he has had intermittent diarrhea and vomiting the last month or so.  Denies any blood in his stool.  Denies dysuria, hematuria, abdominal pain, flank pain.  He reports that Marlin CanaryGoody powders was helping with his chest pain but now his stomach is irritated from the RossmoreGoody powders.     Past Medical History:  Diagnosis Date  . Bipolar 1  disorder (HCC)   . CHF (congestive heart failure) (HCC)   . HIV (human immunodeficiency virus infection) (HCC)   . Lupus (HCC)   . Seizures (HCC)     There are no active problems to display for this patient.   Past Surgical History:  Procedure Laterality Date  . TONSILLECTOMY          Home Medications    Prior to Admission medications   Medication Sig Start Date End Date Taking? Authorizing Provider  divalproex (DEPAKOTE) 500 MG DR tablet Take 1 tablet (500 mg total) by mouth 3 (three) times daily for 30 days. 02/14/19 03/16/19  Ronnie DossMcLean, Roshell Brigham A, PA-C  furosemide (LASIX) 20 MG tablet Take 1 tablet (20 mg total) by mouth daily for 30 days. 02/14/19 03/16/19  Ronnie DossMcLean, Hermen Mario A, PA-C  lisinopril (PRINIVIL,ZESTRIL) 10 MG tablet Take 1 tablet (10 mg total) by mouth daily for 30 days. 02/14/19 03/16/19  Ronnie DossMcLean, Kirbi Farrugia A, PA-C  naproxen (NAPROSYN) 500 MG tablet Take 1 tablet (500 mg total) by mouth 2 (two) times daily for 7 days. 02/14/19 02/21/19  Arlyn DunningMcLean, Caroly Purewal A, PA-C    Family History No family history on file.  Social History Social History   Tobacco Use  . Smoking status: Current Every Day Smoker    Packs/day: 0.50    Types: Cigarettes  Substance Use Topics  . Alcohol use: Not Currently  . Drug use:  Not Currently    Types: Marijuana     Allergies   Amoxil [amoxicillin]   Review of Systems Review of Systems  Constitutional: Positive for chills. Negative for activity change, appetite change, diaphoresis, fatigue, fever and unexpected weight change.  HENT: Negative for ear pain, rhinorrhea and sore throat.   Eyes: Negative for pain and visual disturbance.  Respiratory: Positive for cough (dry) and shortness of breath. Negative for choking, chest tightness and wheezing.   Cardiovascular: Positive for chest pain and leg swelling. Negative for palpitations.  Gastrointestinal: Positive for diarrhea and vomiting. Negative for abdominal pain and nausea.  Genitourinary: Negative for  dysuria and hematuria.  Musculoskeletal: Negative for arthralgias and back pain.  Skin: Negative for color change and rash.  Allergic/Immunologic: Negative for immunocompromised state.  Neurological: Negative for dizziness, seizures, syncope, numbness and headaches.  All other systems reviewed and are negative.    Physical Exam Updated Vital Signs BP 118/78 (BP Location: Right Arm) Comment (BP Location): Pt states ok to take BP on this arm  Pulse 74   Temp 97.7 F (36.5 C) (Oral)   Resp 16   Ht 5\' 6"  (1.676 m)   Wt 72.6 kg   SpO2 100%   BMI 25.82 kg/m   Physical Exam Vitals signs and nursing note reviewed.  Constitutional:      General: He is not in acute distress.    Appearance: Normal appearance. He is not ill-appearing, toxic-appearing or diaphoretic.  HENT:     Head: Normocephalic and atraumatic.     Nose: Nose normal.     Mouth/Throat:     Mouth: Mucous membranes are moist.  Eyes:     Conjunctiva/sclera: Conjunctivae normal.     Pupils: Pupils are equal, round, and reactive to light.  Cardiovascular:     Rate and Rhythm: Normal rate and regular rhythm.  Pulmonary:     Effort: Pulmonary effort is normal.     Breath sounds: Normal breath sounds. No wheezing, rhonchi or rales.  Chest:     Chest wall: No tenderness.  Abdominal:     General: Abdomen is flat.     Palpations: Abdomen is soft.     Tenderness: There is no abdominal tenderness.  Musculoskeletal:     Right lower leg: No edema.     Left lower leg: No edema.  Skin:    General: Skin is warm.     Capillary Refill: Capillary refill takes less than 2 seconds.  Neurological:     General: No focal deficit present.     Mental Status: He is alert.  Psychiatric:        Mood and Affect: Mood normal.      ED Treatments / Results  Labs (all labs ordered are listed, but only abnormal results are displayed) Labs Reviewed  BASIC METABOLIC PANEL - Abnormal; Notable for the following components:      Result  Value   Glucose, Bld 105 (*)    All other components within normal limits  VALPROIC ACID LEVEL - Abnormal; Notable for the following components:   Valproic Acid Lvl <10 (*)    All other components within normal limits  CBC  TROPONIN I  BRAIN NATRIURETIC PEPTIDE  D-DIMER, QUANTITATIVE (NOT AT [facility])    EKG None  Radiology Dg Chest 2 View  Result Date: 02/14/2019 CLINICAL DATA:  Chest pain. History of systemic lupus erythematosus. EXAM: CHEST - 2 VIEW COMPARISON:  None. FINDINGS: Lungs are clear. The heart size and pulmonary vascularity  are normal. No adenopathy. No bone lesions. IMPRESSION: No edema or consolidation.  No evident adenopathy. Electronically Signed   By: Bretta Bang III M.D.   On: 02/14/2019 15:09    Procedures Procedures (including critical care time)  Medications Ordered in ED Medications  ketorolac (TORADOL) injection 30 mg (30 mg Intramuscular Given 02/14/19 1423)     Initial Impression / Assessment and Plan / ED Course  I have reviewed the triage vital signs and the nursing notes.  Pertinent labs & imaging results that were available during my care of the patient were reviewed by me and considered in my medical decision making (see chart for details).  Clinical Course as of Feb 15 1704  Tue Feb 14, 2019  1535 I reassessed the patient at this time and he is sleeping quietly on the stretcher.   [KM]  1656 I reassessed the patient at this time and he states he is doing well and wants to know when he can leave and go home. Lab work is unremarkable. I explained to him the importance of taking all of his medications as prescribed and I will give him refills on his medications.  He was given resources to set up with a new primary care doctor since he is relocated to this area from [place].  Patient is not currently having any pain.   [KM]    Clinical Course User Index [KM] Arlyn Dunning, PA-C       Based on review of vitals, medical screening  exam, lab work and/or imaging, there does not appear to be an acute, emergent etiology for the patient's symptoms. Counseled pt on good return precautions and encouraged both PCP and ED follow-up as needed.  Prior to discharge, I also discussed incidental imaging findings with patient in detail and advised appropriate, recommended follow-up in detail.  Clinical Impression: 1. Atypical chest pain   2. Pain, dental     Disposition: Discharge   This note was prepared with assistance of Dragon voice recognition software. Occasional wrong-word or sound-a-like substitutions may have occurred due to the inherent limitations of voice recognition software.   Final Clinical Impressions(s) / ED Diagnoses   Final diagnoses:  Atypical chest pain  Pain, dental    ED Discharge Orders         Ordered    lisinopril (PRINIVIL,ZESTRIL) 10 MG tablet  Daily     02/14/19 1702    furosemide (LASIX) 20 MG tablet  Daily     02/14/19 1702    divalproex (DEPAKOTE) 500 MG DR tablet  3 times daily     02/14/19 1702    naproxen (NAPROSYN) 500 MG tablet  2 times daily     02/14/19 1703           Jeral Pinch 02/14/19 1705    Little, Ambrose Finland, MD 02/15/19 [phone]

## 2019-02-14 NOTE — ED Triage Notes (Signed)
Pt c/o left arm numbness and left sharp chest pain x 1 week , also c/o dental pain

## 2019-05-02 ENCOUNTER — Emergency Department (HOSPITAL_BASED_OUTPATIENT_CLINIC_OR_DEPARTMENT_OTHER)
Admission: EM | Admit: 2019-05-02 | Discharge: 2019-05-02 | Discharge status: Against Medical Advice | Payer: Self-pay | Attending: Emergency Medicine | Admitting: Emergency Medicine

## 2019-05-02 ENCOUNTER — Encounter (HOSPITAL_BASED_OUTPATIENT_CLINIC_OR_DEPARTMENT_OTHER): Payer: Self-pay | Admitting: *Deleted

## 2019-05-02 ENCOUNTER — Emergency Department (HOSPITAL_BASED_OUTPATIENT_CLINIC_OR_DEPARTMENT_OTHER): Payer: Self-pay

## 2019-05-02 ENCOUNTER — Other Ambulatory Visit: Payer: Self-pay

## 2019-05-02 DIAGNOSIS — F319 Bipolar disorder, unspecified: Secondary | ICD-10-CM | POA: Insufficient documentation

## 2019-05-02 DIAGNOSIS — F17210 Nicotine dependence, cigarettes, uncomplicated: Secondary | ICD-10-CM | POA: Insufficient documentation

## 2019-05-02 DIAGNOSIS — R569 Unspecified convulsions: Secondary | ICD-10-CM | POA: Insufficient documentation

## 2019-05-02 DIAGNOSIS — K0889 Other specified disorders of teeth and supporting structures: Secondary | ICD-10-CM | POA: Insufficient documentation

## 2019-05-02 DIAGNOSIS — Z21 Asymptomatic human immunodeficiency virus [HIV] infection status: Secondary | ICD-10-CM | POA: Insufficient documentation

## 2019-05-02 DIAGNOSIS — I509 Heart failure, unspecified: Secondary | ICD-10-CM | POA: Insufficient documentation

## 2019-05-02 LAB — CBC WITH DIFFERENTIAL/PLATELET
Basophils Absolute: 0 10*3/uL (ref 0.0–0.1)
Basophils Relative: 0 %
Eosinophils Absolute: 0.1 10*3/uL (ref 0.0–0.5)
Eosinophils Relative: 1 %
HCT: 45 % (ref 39.0–52.0)
Hemoglobin: 15.2 g/dL (ref 13.0–17.0)
Lymphocytes Relative: 46 %
Lymphs Abs: 2.3 10*3/uL (ref 0.7–4.0)
MCHC: 33.8 g/dL (ref 30.0–36.0)
MCV: 96.2 fL (ref 80.0–100.0)
Monocytes Absolute: 0.3 10*3/uL (ref 0.1–1.0)
Monocytes Relative: 7 %
Neutro Abs: 2.3 10*3/uL (ref 1.7–7.7)
Neutrophils Relative %: 45 %
Platelets: 287 10*3/uL (ref 150–400)
RBC: 4.68 MIL/uL (ref 4.22–5.81)
RDW: 13.2 % (ref 11.5–15.5)
WBC: 5.1 10*3/uL (ref 4.0–10.5)

## 2019-05-02 LAB — COMPREHENSIVE METABOLIC PANEL
ALT: 16 U/L (ref 0–44)
AST: 21 U/L (ref 15–41)
Albumin: 4.5 g/dL (ref 3.5–5.0)
Alkaline Phosphatase: 59 U/L (ref 38–126)
Anion gap: 8 (ref 5–15)
BUN: 14 mg/dL (ref 6–20)
CO2: 24 mmol/L (ref 22–32)
Calcium: 8.9 mg/dL (ref 8.9–10.3)
Chloride: 107 mmol/L (ref 98–111)
Creatinine, Ser: 1.12 mg/dL (ref 0.61–1.24)
GFR calc Af Amer: >60 mL/min (ref >60)
GFR calc non Af Amer: >60 mL/min (ref >60)
Glucose, Bld: 102 mg/dL — ABNORMAL HIGH (ref 70–99)
Potassium: 3.5 mmol/L (ref 3.5–5.1)
Sodium: 139 mmol/L (ref 135–145)
Total Bilirubin: 0.7 mg/dL (ref 0.3–1.2)
Total Protein: 7.3 g/dL (ref 6.5–8.1)

## 2019-05-02 MED ORDER — HYDROCODONE-ACETAMINOPHEN 5-325 MG PO TABS
1.0000 | ORAL_TABLET | Freq: Once | ORAL | Status: AC
  Administered 2019-05-02: 1 via ORAL
  Filled 2019-05-02: qty 1

## 2019-05-02 NOTE — ED Provider Notes (Signed)
[facility] EMERGENCY DEPARTMENT Provider Note   CSN: [id number] Arrival date & time: 05/02/19  1340    History   Chief Complaint Chief Complaint  Patient presents with  . Dental Pain    HPI Scott Stewart is a 28 y.o. male.     HPI  Past Medical History:  Diagnosis Date  . Bipolar 1 disorder (HCC)   . CHF (congestive heart failure) (HCC)   . HIV (human immunodeficiency virus infection) (HCC)   . Lupus (HCC)   . Seizures (HCC)     There are no active problems to display for this patient.   Past Surgical History:  Procedure Laterality Date  . TONSILLECTOMY          Home Medications    Prior to Admission medications   Medication Sig Start Date End Date Taking? Authorizing Provider  divalproex (DEPAKOTE) 500 MG DR tablet Take 1 tablet (500 mg total) by mouth 3 (three) times daily for 30 days. 02/14/19 05/02/19 Yes Ronnie Doss A, PA-C  furosemide (LASIX) 20 MG tablet Take 1 tablet (20 mg total) by mouth daily for 30 days. 02/14/19 05/02/19 Yes Ronnie Doss A, PA-C  lisinopril (PRINIVIL,ZESTRIL) 10 MG tablet Take 1 tablet (10 mg total) by mouth daily for 30 days. 02/14/19 05/02/19 Yes Arlyn Dunning, PA-C    Family History No family history on file.  Social History Social History   Tobacco Use  . Smoking status: Current Every Day Smoker    Packs/day: 0.50    Types: Cigarettes  . Smokeless tobacco: Never Used  Substance Use Topics  . Alcohol use: Not Currently  . Drug use: Not Currently    Types: Marijuana     Allergies   Amoxil [amoxicillin]   Review of Systems Review of Systems  Constitutional: Negative for chills and fever.  HENT: Positive for dental problem and facial swelling.   Eyes: Negative for visual disturbance.  Respiratory: Negative for cough and shortness of breath.   Gastrointestinal: Negative for abdominal pain, nausea and vomiting.  Skin: Negative for color change and rash.  Neurological: Positive for seizures and headaches.  Negative for dizziness, tremors, syncope, speech difficulty, weakness, light-headedness and numbness.  All other systems reviewed and are negative.    Physical Exam Updated Vital Signs BP 118/69   Pulse 78   Temp 97.8 F (36.6 C) (Oral)   Resp 20   Ht 5\' 6"  (1.676 m)   Wt 72.6 kg   SpO2 100%   BMI 25.82 kg/m   Physical Exam Vitals signs and nursing note reviewed.  Constitutional:      General: He is not in acute distress.    Appearance: Normal appearance. He is well-developed and normal weight. He is not ill-appearing or diaphoretic.  HENT:     Head: Normocephalic and atraumatic.     Comments: No palpable deformity over the scalp, no hematoma, step-off or deformity, no battle sign.  No CSF otorrhea.    Mouth/Throat:     Comments: Tenderness to palpation over the left lower posterior molar with rough posterior edge where portion of the tooth has broken away, there is surrounding erythema of the gums but no obvious abscess or expressible drainage.  There is tenderness of the surrounding gums, no significant induration or tenderness beneath the tongue and posterior oropharynx is clear.  No trismus, normal phonation.  Patient does have some tenderness along the underside of the jaw on the left, but no significant appreciable swelling. Eyes:  General:        Right eye: No discharge.        Left eye: No discharge.     Extraocular Movements: Extraocular movements intact.     Conjunctiva/sclera: Conjunctivae normal.     Pupils: Pupils are equal, round, and reactive to light.  Cardiovascular:     Rate and Rhythm: Normal rate and regular rhythm.     Heart sounds: Normal heart sounds. No murmur. No friction rub. No gallop.   Pulmonary:     Effort: Pulmonary effort is normal. No respiratory distress.     Breath sounds: Normal breath sounds.     Comments: Respirations equal and unlabored, patient able to speak in full sentences, lungs clear to auscultation bilaterally Abdominal:      General: Abdomen is flat. Bowel sounds are normal. There is no distension.     Palpations: Abdomen is soft. There is no mass.     Tenderness: There is no abdominal tenderness. There is no guarding.  Skin:    General: Skin is warm and dry.  Neurological:     Mental Status: He is alert.     Coordination: Coordination normal.     Comments: Speech is clear, able to follow commands CN III-XII intact Normal strength in upper and lower extremities bilaterally including dorsiflexion and plantar flexion, strong and equal grip strength Sensation normal to light and sharp touch Moves extremities without ataxia, coordination intact  Psychiatric:        Mood and Affect: Mood normal.        Behavior: Behavior normal.      ED Treatments / Results  Labs (all labs ordered are listed, but only abnormal results are displayed) Labs Reviewed  COMPREHENSIVE METABOLIC PANEL - Abnormal; Notable for the following components:      Result Value   Glucose, Bld 102 (*)    All other components within normal limits  CBC WITH DIFFERENTIAL/PLATELET    EKG None  Radiology No results found.  Procedures Procedures (including critical care time)  Medications Ordered in ED Medications  HYDROcodone-acetaminophen (NORCO/VICODIN) 5-325 MG per tablet 1 tablet (1 tablet Oral Given 05/02/19 1432)     Initial Impression / Assessment and Plan / ED Course  I have reviewed the triage vital signs and the nursing notes.  Pertinent labs & imaging results that were available during my care of the patient were reviewed by me and considered in my medical decision making (see chart for details).  Patient reports dental pain over the past 3 weeks worsening last night after he felt the tooth break while eating.  He reports pain became so severe today he had a witnessed seizure at work where his boss reports he fell from his chair striking his head and then had a minute or so of convulsions and then came out of it.  Patient  reports he does not recall the event but has since had worsening dental pain as well as a mild headache.  He is alert and oriented with no focal neurologic deficits on arrival, normal vitals.  He does have a palpable broken left lower molar with a small amount of erythema surrounding but no obvious abscess.  Given seizure activity and persistent dental pain, discussed with Dr. Deretha EmoryZackowski will plan for basic labs, CT of the head and CT max face with contrast to assess for any injury or fracture as well as deep space infection.  Notified by nursing that patient wishes to leave.  He reports he just  received a phone call from his dentist reporting that they can see him right now to evaluate him and potentially pull the tooth that is causing his issues.  I discussed with the patient that given that he had a seizure earlier today and fell and hit his head that I would like to complete his evaluation with CT imaging prior to him leaving, but if he wished to leave AGAINST MEDICAL ADVICE and go to his dental appointment he could.  I discussed the potential risks of leaving prior to evaluation being completed.  Patient is alert and oriented and has full decision-making capacity.  He still wishes to leave and signed out AMA.  Final Clinical Impressions(s) / ED Diagnoses   Final diagnoses:  Pain, dental  Seizure [facility])    ED Discharge Orders    None       Dartha Lodge, [place] 05/02/19 1752    Vanetta Mulders, MD 05/03/19 [phone]

## 2019-05-02 NOTE — ED Triage Notes (Signed)
Dental pain x 3 weeks

## 2019-05-02 NOTE — ED Notes (Signed)
Pt stating he has been able to talk to an emergency dentist and he needs to leave so he can have a tooth pulled. Ford, PA-C discussed that pt would leave AMA. Pt accepted the risks and decided to check out.

## 2019-08-25 ENCOUNTER — Emergency Department (HOSPITAL_BASED_OUTPATIENT_CLINIC_OR_DEPARTMENT_OTHER)
Admission: EM | Admit: 2019-08-25 | Discharge: 2019-08-25 | Disposition: A | Discharge status: Home / Self Care | Payer: Self-pay | Attending: Emergency Medicine | Admitting: Emergency Medicine

## 2019-08-25 ENCOUNTER — Encounter (HOSPITAL_BASED_OUTPATIENT_CLINIC_OR_DEPARTMENT_OTHER): Payer: Self-pay | Admitting: *Deleted

## 2019-08-25 ENCOUNTER — Other Ambulatory Visit: Payer: Self-pay

## 2019-08-25 DIAGNOSIS — Z79899 Other long term (current) drug therapy: Secondary | ICD-10-CM | POA: Insufficient documentation

## 2019-08-25 DIAGNOSIS — I509 Heart failure, unspecified: Secondary | ICD-10-CM | POA: Insufficient documentation

## 2019-08-25 DIAGNOSIS — R569 Unspecified convulsions: Secondary | ICD-10-CM | POA: Insufficient documentation

## 2019-08-25 DIAGNOSIS — Z21 Asymptomatic human immunodeficiency virus [HIV] infection status: Secondary | ICD-10-CM | POA: Insufficient documentation

## 2019-08-25 DIAGNOSIS — K0889 Other specified disorders of teeth and supporting structures: Secondary | ICD-10-CM | POA: Insufficient documentation

## 2019-08-25 DIAGNOSIS — F17210 Nicotine dependence, cigarettes, uncomplicated: Secondary | ICD-10-CM | POA: Insufficient documentation

## 2019-08-25 MED ORDER — HYDROCODONE-ACETAMINOPHEN 5-325 MG PO TABS
1.0000 | ORAL_TABLET | Freq: Once | ORAL | Status: AC
  Administered 2019-08-25: 1 via ORAL
  Filled 2019-08-25: qty 1

## 2019-08-25 MED ORDER — TRAMADOL HCL 50 MG PO TABS: 50.0000 mg | ORAL_TABLET | Freq: Four times a day (QID) | ORAL | 0 refills | Status: DC | PRN

## 2019-08-25 MED ORDER — CLINDAMYCIN HCL 150 MG PO CAPS: 300.0000 mg | ORAL_CAPSULE | Freq: Four times a day (QID) | ORAL | 0 refills | Status: DC

## 2019-08-25 MED ORDER — BUPIVACAINE-EPINEPHRINE (PF) 0.5% -1:200000 IJ SOLN
INTRAMUSCULAR | Status: AC
  Administered 2019-08-25
  Filled 2019-08-25: qty 1.8

## 2019-08-25 NOTE — ED Provider Notes (Signed)
[facility] EMERGENCY DEPARTMENT Provider Note   CSN: [id number] Arrival date & time: 08/25/19  2307     History   Chief Complaint Chief Complaint  Patient presents with  . Dental Pain    HPI Scott Stewart is a 28 y.o. male.     Patient presents to the emergency department for evaluation of dental pain.  He reports severe pain in the right lower side of his mouth.  He has been told that he needs his wisdom teeth removed.  No facial swelling or fever.     Past Medical History:  Diagnosis Date  . Bipolar 1 disorder ([place])   . CHF (congestive heart failure) ([place])   . HIV (human immunodeficiency virus infection) ([place])   . Lupus ([place])   . Seizures ([place])     There are no active problems to display for this patient.   Past Surgical History:  Procedure Laterality Date  . TONSILLECTOMY          Home Medications    Prior to Admission medications   Medication Sig Start Date End Date Taking? Authorizing Provider  clindamycin (CLEOCIN) 150 MG capsule Take 2 capsules (300 mg total) by mouth 4 (four) times daily. 08/25/19   Orpah Greek, MD  divalproex (DEPAKOTE) 500 MG DR tablet Take 1 tablet (500 mg total) by mouth 3 (three) times daily for 30 days. 02/14/19 05/02/19  Madilyn Hook A, PA-C  furosemide (LASIX) 20 MG tablet Take 1 tablet (20 mg total) by mouth daily for 30 days. 02/14/19 05/02/19  Madilyn Hook A, PA-C  lisinopril (PRINIVIL,ZESTRIL) 10 MG tablet Take 1 tablet (10 mg total) by mouth daily for 30 days. 02/14/19 05/02/19  Alveria Apley, PA-C  traMADol (ULTRAM) 50 MG tablet Take 1 tablet (50 mg total) by mouth every 6 (six) hours as needed. 08/25/19   Orpah Greek, MD    Family History No family history on file.  Social History Social History   Tobacco Use  . Smoking status: Current Every Day Smoker    Packs/day: 0.50    Types: Cigarettes  . Smokeless tobacco: Never Used  Substance Use Topics  . Alcohol use: Not Currently  . Drug  use: Not Currently    Types: Marijuana     Allergies   Amoxil [amoxicillin]   Review of Systems Review of Systems  HENT: Positive for dental problem.   All other systems reviewed and are negative.    Physical Exam Updated Vital Signs BP (!) 153/100   Pulse 79   Temp 98.4 F (36.9 C) (Oral)   Resp 18   Ht 5\' 6"  (1.676 m)   Wt 72.6 kg   SpO2 100%   BMI 25.82 kg/m   Physical Exam Vitals signs and nursing note reviewed.  Constitutional:      Appearance: Normal appearance.  HENT:     Head: Normocephalic.     Mouth/Throat:     Dentition: Gingival swelling and dental caries present.   Neck:     Musculoskeletal: Full passive range of motion without pain and neck supple.  Cardiovascular:     Rate and Rhythm: Normal rate and regular rhythm.  Pulmonary:     Effort: Pulmonary effort is normal.     Breath sounds: Normal breath sounds.  Lymphadenopathy:     Cervical: No cervical adenopathy.  Skin:    Findings: No erythema.  Neurological:     Mental Status: He is alert and oriented to person, place, and time.  Cranial Nerves: Cranial nerves are intact.     Sensory: Sensation is intact.     Motor: Motor function is intact.      ED Treatments / Results  Labs (all labs ordered are listed, but only abnormal results are displayed) Labs Reviewed - No data to display  EKG None  Radiology No results found.  Procedures Dental Block  Date/Time: 08/25/2019 11:41 PM Performed by: Gilda CreasePollina, Tagen Milby J, MD Authorized by: Gilda CreasePollina, Conan Mcmanaway J, MD   Consent:    Consent obtained:  Verbal   Consent given by:  Patient   Risks discussed:  Allergic reaction, nerve damage, swelling, unsuccessful block and intravascular injection Universal protocol:    Procedure explained and questions answered to patient or proxy's satisfaction: yes     Site/side marked: yes     Immediately prior to procedure, a time out was called: yes     Patient identity confirmed:  Verbally  with patient Indications:    Indications: dental pain   Location:    Block type:  Inferior alveolar   Laterality:  Right Procedure details (see MAR for exact dosages):    Syringe type:  Aspirating dental syringe   Needle gauge:  27 G   Anesthetic injected:  Bupivacaine 0.5% WITH epi   Injection procedure:  Anatomic landmarks identified, introduced needle, incremental injection, negative aspiration for blood and anatomic landmarks palpated Post-procedure details:    Outcome:  Anesthesia achieved   Patient tolerance of procedure:  Tolerated well, no immediate complications   (including critical care time)  Medications Ordered in ED Medications  HYDROcodone-acetaminophen (NORCO/VICODIN) 5-325 MG per tablet 1 tablet (has no administration in time range)  bupivacaine-epinephrine (MARCAINE W/ EPI) 0.5% -1:200000 injection (  Given by Other 08/25/19 2343)     Initial Impression / Assessment and Plan / ED Course  I have reviewed the triage vital signs and the nursing notes.  Pertinent labs & imaging results that were available during my care of the patient were reviewed by me and considered in my medical decision making (see chart for details).        Patient presents with severe dental pain secondary to dental decay, no signs of dental abscess or Ludwig's angina.  Patient appears well.  Final Clinical Impressions(s) / ED Diagnoses   Final diagnoses:  Pain, dental    ED Discharge Orders         Ordered    clindamycin (CLEOCIN) 150 MG capsule  4 times daily     08/25/19 2343    traMADol (ULTRAM) 50 MG tablet  Every 6 hours PRN     08/25/19 2343           Gilda CreasePollina, Xochilt Conant J, MD 08/25/19 2343

## 2019-08-25 NOTE — ED Triage Notes (Signed)
Pt c/o dental pain x 3 months increased pain x 2 days

## 2019-08-28 MED FILL — traMADol HCL 50 MG TABS: 50 | 3 days supply | Qty: 15 | Fill #0

## 2019-08-28 MED FILL — CLINDAMYCIN HCL 150 MG CAPS: 150 | 80 days supply | Qty: 80 | Fill #0

## 2019-11-16 ENCOUNTER — Encounter (HOSPITAL_BASED_OUTPATIENT_CLINIC_OR_DEPARTMENT_OTHER): Payer: Self-pay

## 2019-11-16 ENCOUNTER — Other Ambulatory Visit: Payer: Self-pay

## 2019-11-16 ENCOUNTER — Emergency Department (HOSPITAL_BASED_OUTPATIENT_CLINIC_OR_DEPARTMENT_OTHER)
Admission: EM | Admit: 2019-11-16 | Discharge: 2019-11-16 | Disposition: A | Discharge status: Home / Self Care | Payer: Self-pay | Attending: Emergency Medicine | Admitting: Emergency Medicine

## 2019-11-16 DIAGNOSIS — Z880 Allergy status to penicillin: Secondary | ICD-10-CM | POA: Insufficient documentation

## 2019-11-16 DIAGNOSIS — I509 Heart failure, unspecified: Secondary | ICD-10-CM | POA: Insufficient documentation

## 2019-11-16 DIAGNOSIS — B20 Human immunodeficiency virus [HIV] disease: Secondary | ICD-10-CM | POA: Insufficient documentation

## 2019-11-16 DIAGNOSIS — K0889 Other specified disorders of teeth and supporting structures: Secondary | ICD-10-CM | POA: Insufficient documentation

## 2019-11-16 DIAGNOSIS — F17210 Nicotine dependence, cigarettes, uncomplicated: Secondary | ICD-10-CM | POA: Insufficient documentation

## 2019-11-16 DIAGNOSIS — Z79899 Other long term (current) drug therapy: Secondary | ICD-10-CM | POA: Insufficient documentation

## 2019-11-16 DIAGNOSIS — Z91018 Allergy to other foods: Secondary | ICD-10-CM | POA: Insufficient documentation

## 2019-11-16 DIAGNOSIS — M3210 Systemic lupus erythematosus, organ or system involvement unspecified: Secondary | ICD-10-CM | POA: Insufficient documentation

## 2019-11-16 MED ORDER — CLINDAMYCIN HCL 150 MG PO CAPS: 450.0000 mg | ORAL_CAPSULE | Freq: Three times a day (TID) | ORAL | 0 refills | Status: AC

## 2019-11-16 MED ORDER — ACETAMINOPHEN 500 MG PO TABS: 500.0000 mg | ORAL_TABLET | Freq: Four times a day (QID) | ORAL | 0 refills | Status: DC | PRN

## 2019-11-16 MED ORDER — ACETAMINOPHEN 325 MG PO TABS
650.0000 mg | ORAL_TABLET | Freq: Once | ORAL | Status: AC
  Administered 2019-11-16: 650 mg via ORAL
  Filled 2019-11-16: qty 2

## 2019-11-16 NOTE — ED Provider Notes (Signed)
[facility] EMERGENCY DEPARTMENT Provider Note   CSN: [id number] Arrival date & time: 11/16/19  1622     History   Chief Complaint Chief Complaint  Patient presents with  . Dental Pain    HPI Scott Stewart is a 28 y.o. male with PMH significant for HIV, seizures, lupus, bipolar 1 disorder who presents to the ED with a 5-day history of worsening right-sided dental pain.  Patient reports that he had his left lower wisdom tooth extracted, but his right lower tooth is broken and likely infected.  He reports that he has been taking lots of Goody powders, with little relief.  He has a dentist appointment scheduled, but it is not until mid December.  He denies any fevers or chills, trismus, respiratory difficulty, voice changes, inability to eat or drink, neck swelling, headache, dizziness, or other symptoms.       HPI  Past Medical History:  Diagnosis Date  . Bipolar 1 disorder ([place])   . CHF (congestive heart failure) ([place])   . HIV (human immunodeficiency virus infection) ([place])   . Lupus ([place])   . Seizures ([place])     There are no active problems to display for this patient.   Past Surgical History:  Procedure Laterality Date  . TONSILLECTOMY          Home Medications    Prior to Admission medications   Medication Sig Start Date End Date Taking? Authorizing Provider  acetaminophen (TYLENOL) 500 MG tablet Take 1 tablet (500 mg total) by mouth every 6 (six) hours as needed. 11/16/19   Corena Herter, PA-C  clindamycin (CLEOCIN) 150 MG capsule Take 3 capsules (450 mg total) by mouth 3 (three) times daily for 10 days. 11/16/19 11/26/19  Corena Herter, PA-C  divalproex (DEPAKOTE) 500 MG DR tablet Take 1 tablet (500 mg total) by mouth 3 (three) times daily for 30 days. 02/14/19 05/02/19  Madilyn Hook A, PA-C  furosemide (LASIX) 20 MG tablet Take 1 tablet (20 mg total) by mouth daily for 30 days. 02/14/19 05/02/19  Madilyn Hook A, PA-C  lisinopril (PRINIVIL,ZESTRIL) 10  MG tablet Take 1 tablet (10 mg total) by mouth daily for 30 days. 02/14/19 05/02/19  Alveria Apley, PA-C  traMADol (ULTRAM) 50 MG tablet Take 1 tablet (50 mg total) by mouth every 6 (six) hours as needed. 08/25/19   Orpah Greek, MD    Family History No family history on file.  Social History Social History   Tobacco Use  . Smoking status: Current Every Day Smoker    Packs/day: 0.50    Types: Cigarettes  . Smokeless tobacco: Never Used  Substance Use Topics  . Alcohol use: Not Currently  . Drug use: Yes    Types: Marijuana     Allergies   Amoxil [amoxicillin] and Strawberry (diagnostic)   Review of Systems Review of Systems  Constitutional: Negative for fever.  HENT: Positive for dental problem. Negative for drooling, trouble swallowing and voice change.   Respiratory: Negative for shortness of breath.      Physical Exam Updated Vital Signs BP 127/79 (BP Location: Left Arm)   Pulse 93   Temp 99.2 F (37.3 C) (Oral)   Resp 18   Ht 5\' 6"  (1.676 m)   Wt 76.2 kg   SpO2 99%   BMI 27.12 kg/m   Physical Exam Vitals signs and nursing note reviewed. Exam conducted with a chaperone present.  Constitutional:      Appearance: Normal appearance.  He is not ill-appearing.  HENT:     Head: Normocephalic and atraumatic.     Nose: Nose normal.     Mouth/Throat:     Comments: Poor dentition throughout.  #31 and #32 teeth TTP.  No instability.  Surrounding mild erythema and induration.  Mild halitosis.  No masses appreciated.  No fluctuance.  Oropharynx is patent.  Uvula is midline.  No tonsillar hypertrophy.  No tongue or soft palate swelling. Eyes:     General: No scleral icterus.    Conjunctiva/sclera: Conjunctivae normal.  Neck:     Musculoskeletal: Normal range of motion and neck supple. No neck rigidity or muscular tenderness.     Comments: No right-sided masses or swelling appreciated.  No sublingual swelling. Cardiovascular:     Rate and Rhythm: Normal rate  and regular rhythm.     Pulses: Normal pulses.     Heart sounds: Normal heart sounds.  Pulmonary:     Effort: Pulmonary effort is normal.     Breath sounds: Normal breath sounds.  Skin:    General: Skin is dry.  Neurological:     Mental Status: He is alert.     GCS: GCS eye subscore is 4. GCS verbal subscore is 5. GCS motor subscore is 6.  Psychiatric:        Mood and Affect: Mood normal.        Behavior: Behavior normal.        Thought Content: Thought content normal.      ED Treatments / Results  Labs (all labs ordered are listed, but only abnormal results are displayed) Labs Reviewed - No data to display  EKG None  Radiology No results found.  Procedures Procedures (including critical care time)  Medications Ordered in ED Medications  acetaminophen (TYLENOL) tablet 650 mg (650 mg Oral Given 11/16/19 1726)     Initial Impression / Assessment and Plan / ED Course  I have reviewed the triage vital signs and the nursing notes.  Pertinent labs & imaging results that were available during my care of the patient were reviewed by me and considered in my medical decision making (see chart for details).        Will prescribe patient a short course of clindamycin for his periodontitis and presumed infection.  Patient has significant tooth decay over #32 tooth with mild surrounding erythema and induration.  No mass appreciated or fluctuance appreciated that is conducive to incision and drainage.  Patient was given Tylenol 650 mg here in the ED which improved his pain symptoms.  He was actually able to smile and once again opened his mouth demonstrated lack of trismus.  Discussed strict return precautions including but not limited to wheezing, difficulty breathing, shortness of breath, fevers or chills, inability to open his mouth, inability to eat or drink, voice changes, or any other new or worsening symptoms.  Will discharge patient with clindamycin given his allergy to  amoxicillin.  Will also prescribe patient Tylenol 500 mg as he reports that is what best treats his pain.  He will follow up with his dentist in approximately 4 weeks for more definitive management.  Patient voiced understanding of the return precautions and is agreeable to the plan.   Final Clinical Impressions(s) / ED Diagnoses   Final diagnoses:  Pain, dental    ED Discharge Orders         Ordered    acetaminophen (TYLENOL) 500 MG tablet  Every 6 hours PRN     11/16/19  1806    clindamycin (CLEOCIN) 150 MG capsule  3 times daily     11/16/19 1806           Deke, [place] 11/16/19 1807    Milagros Loll, MD 11/18/19 1515

## 2019-11-16 NOTE — ED Triage Notes (Signed)
Pt c/o right lower toothache-NAD-steady gait

## 2019-11-16 NOTE — ED Notes (Signed)
ED Provider at bedside. 

## 2019-11-21 NOTE — ED Provider Notes (Signed)
Associated Order(s): Incision/Drainage  Formatting of this note is different from the original.    [facility] Emergency Department Provider Note    Provider at Bedside: 11/21/2019 9:36 AM    History     Chief Complaint   Patient presents with   ? Hand Pain       History of Present Illness    History obtained from:Patient    Tony Mccarthy. is a 28 y.o. male with a complaint of right thumb pain with onset 5 days ago. Patient works for [employer] and notes 5 days ago he was bitten by some type of bug on the right thumb (but did not visualize it) and hit his thumb on a box while trying to swat the bug away. He noticed numbness in the right thumb immediately afterward with onset of pain, swelling, and tingling in the right thumb the day after. Patient reports associated "Canter" drainage from the right thumb, last noticed drainage this morning. He is still able to move the thumb, but this exacerbates the pain. Denies history of diabetes. Patient denies fever, chills, nausea, vomiting, diarrhea, chest pain, numbness/tingling, rash.     Past Medical History  History reviewed. No pertinent past medical history.    Past Surgical History  No past surgical history on file.    Current Medications  No current outpatient medications on file.     Allergies  Allergies   Allergen Reactions   ? Amoxicillin Tachycardia / Palpitations  (intolerance)     Family History  History reviewed. No pertinent family history.    Social History  Social History     Tobacco Use   ? Smoking status: Not on file   Substance Use Topics   ? Alcohol use: Not on file   ? Drug use: Not on file     Review of Systems     Review of Systems   Constitutional: Negative for fever.   Respiratory: Negative for shortness of breath.    Cardiovascular: Negative for chest pain.   Gastrointestinal: Negative for nausea and vomiting.   Musculoskeletal: Positive for arthralgias (right thumb with associated swelling). Negative for joint swelling and myalgias.         Positive for pus drainage from right thumb.   Skin: Positive for color change. Negative for rash and wound.   Allergic/Immunologic: Negative for immunocompromised state.   Neurological: Negative for weakness and numbness.   Hematological: Does not bruise/bleed easily.   Psychiatric/Behavioral: Negative for self-injury.     Physical Exam     BP 118/79   Pulse 98   Temp 98.3 F (36.8 C)   Resp 16   Ht 1.651 m (5\' 5" )   Wt 72.6 kg (160 lb)   SpO2 97%   BMI 26.63 kg/m     Physical Exam   Constitutional: He is oriented to person, place, and time. He appears well-developed and well-nourished.  Non-toxic appearance. No distress.   Cardiovascular: Normal rate, regular rhythm and normal heart sounds.   No murmur heard.  Pulmonary/Chest: Effort normal and breath sounds normal. No respiratory distress. He has no wheezes. He has no rales. He exhibits no tenderness.   Musculoskeletal:      Comments: Right thumb with a paronychia with swelling and tenderness to palpation diffusely. Full range of motion of right thumb. Sensation to right thumb slightly decreased. Sensation intact to remainder of hand.   Neurological: He is alert and oriented to person, place, and time. He exhibits  normal muscle tone.   Skin: Skin is warm and dry.   To the right thumb there is a paronychia    Psychiatric: He has a normal mood and affect. His behavior is normal.   Nursing note and vitals reviewed.    Procedures     Incision/Drainage    Date/Time: 11/21/2019 10:21 AM  Performed by: Consuelo Pandy, PA-C  Authorized by: Jarome Lamas, MD     Verbal consent obtained?: Yes    Risks and benefits: Risks, benefits and alternatives were discussed    Consent given by:  Patient  Patient states understanding of procedure being performed: Yes    Required items: Required blood products, implants, devices and special equipment available    Patient identity confirmed:  Verbally with patient  Time out: Immediately prior to the procedure a time  out was called    Indications for incision and drainage: paronychia   Body area:  Upper extremity  Location details:  Right thumb  Location details:  Right thumb  Location details:  Right thumb  Location details:  Right thumb  Location details:  Right thumb  Location details:  Right thumb  Location details:  Right thumb  Patient sedated: No    Scalpel size:  11  Incision type:  Single straight  Incision depth:  Dermal  Complexity:  Simple  Drainage:  Purulent and bloody  Drainage amount:  Moderate  Wound treatment:  Wound left open   patient tolerated the procedure well with no immediate complications      ED Course         11/21/2019 10:30 AM - Discussed findings and plan of care with patient and/or family. Patient/family are in agreement with plan. Patient advised to follow-up with PCP or to return for any worsening symptoms. Patient discharged with prescriptions for bactrim.     Medical Decision Making     MDM  Number of Diagnoses or Management Options  Paronychia of right thumb:   Diagnosis management comments: 28 year old male presenting with right thumb pain onset 5 days ago.  He is afebrile see vital signs.  Exam consistent with a paronychia of the right thumb.  No tenderness over the flexor tendon suggest flexor tenosynovitis.  He is neurovascular tact distally.  He has full range of motion of the thumb.  An I&D was performed with central drainage.  Patient was prescribed Bactrim and referred to PCP for follow-up.    DDx: Felon, paronychia, cellulitis, septic arthritis      Clinical Impression, Assessment and Plan     Diagnosis  1. Paronychia of right thumb      ED Medications Given During Visit  Medications - No data to display    Medications Prescribed  New Prescriptions    SULFAMETHOXAZOLE-TRIMETHOPRIM (BACTRIM DS) 800-160 MG PER TABLET    Take 1 tablet by mouth 2 times daily for 10 days. smx-tmp DS (BACTRIM) 800-160 mg tabs (1tab q12 D14)     Follow-up  Physician and [facility] [place] [address]  [phone]  Call   To establish a PCP    Emergency - [facility], [facility]  [address]  [place] [address]  [phone]  Go to   As needed, If symptoms worsen    __________________________________________________________  Scribe's Attestation: Darlys Gales, PA-C obtained and performed the history, physical exam and medical decision making elements that were entered into the chart. Documentation assistance was  provided by me personally, a scribe. Signed by Erasmo DownerHamilton Stoffel, Scribe on 11/21/2019 9:36 AM    Documentation assistance provided by the scribe. I was present during the time the encounter was recorded. The information recorded by the scribe was done at my direction and has been reviewed and validated by me.  Darlys GalesJulie Gupton, PA-C  11/21/2019 9:36 AM    Electronically signed by: Consuelo PandyJulie Steen Gupton, PA-C  11/22/19 1532    Electronically signed by Jarome LamasWoody, Jonathan Harris, MD at 11/25/2019  3:46 PM EST

## 2019-11-21 NOTE — ED Triage Notes (Signed)
Formatting of this note might be different from the original.  Pt presents with complaints of being bitten by something at work, three days later he reports swelling and pain with drainage to right thumb   Electronically signed by Heloise Purpura, RN at 11/21/2019  9:21 AM EST

## 2020-04-10 ENCOUNTER — Other Ambulatory Visit: Payer: Self-pay

## 2020-04-10 ENCOUNTER — Encounter (HOSPITAL_BASED_OUTPATIENT_CLINIC_OR_DEPARTMENT_OTHER): Payer: Self-pay

## 2020-04-10 ENCOUNTER — Emergency Department (HOSPITAL_BASED_OUTPATIENT_CLINIC_OR_DEPARTMENT_OTHER)
Admission: EM | Admit: 2020-04-10 | Discharge: 2020-04-10 | Disposition: A | Discharge status: Home / Self Care | Payer: Self-pay | Attending: Emergency Medicine | Admitting: Emergency Medicine

## 2020-04-10 DIAGNOSIS — I509 Heart failure, unspecified: Secondary | ICD-10-CM | POA: Insufficient documentation

## 2020-04-10 DIAGNOSIS — Z91018 Allergy to other foods: Secondary | ICD-10-CM | POA: Insufficient documentation

## 2020-04-10 DIAGNOSIS — M3210 Systemic lupus erythematosus, organ or system involvement unspecified: Secondary | ICD-10-CM | POA: Insufficient documentation

## 2020-04-10 DIAGNOSIS — Z880 Allergy status to penicillin: Secondary | ICD-10-CM | POA: Insufficient documentation

## 2020-04-10 DIAGNOSIS — F17210 Nicotine dependence, cigarettes, uncomplicated: Secondary | ICD-10-CM | POA: Insufficient documentation

## 2020-04-10 DIAGNOSIS — B20 Human immunodeficiency virus [HIV] disease: Secondary | ICD-10-CM | POA: Insufficient documentation

## 2020-04-10 DIAGNOSIS — K0889 Other specified disorders of teeth and supporting structures: Secondary | ICD-10-CM | POA: Insufficient documentation

## 2020-04-10 DIAGNOSIS — G40909 Epilepsy, unspecified, not intractable, without status epilepticus: Secondary | ICD-10-CM | POA: Insufficient documentation

## 2020-04-10 DIAGNOSIS — Z79899 Other long term (current) drug therapy: Secondary | ICD-10-CM | POA: Insufficient documentation

## 2020-04-10 MED ORDER — KETOROLAC TROMETHAMINE 15 MG/ML IJ SOLN
15.0000 mg | Freq: Once | INTRAMUSCULAR | Status: AC
  Administered 2020-04-10: 15 mg via INTRAVENOUS
  Filled 2020-04-10: qty 1

## 2020-04-10 MED ORDER — BUPIVACAINE-EPINEPHRINE (PF) 0.5% -1:200000 IJ SOLN
1.8000 mL | Freq: Once | INTRAMUSCULAR | Status: AC
  Administered 2020-04-10: 1.8 mL
  Filled 2020-04-10: qty 1.8

## 2020-04-10 MED ORDER — CLINDAMYCIN HCL 150 MG PO CAPS: 450.0000 mg | ORAL_CAPSULE | Freq: Three times a day (TID) | ORAL | 0 refills | Status: AC

## 2020-04-10 MED ORDER — OXYCODONE HCL 5 MG PO TABS
5.0000 mg | ORAL_TABLET | Freq: Once | ORAL | Status: AC
  Administered 2020-04-10: 5 mg via ORAL
  Filled 2020-04-10: qty 1

## 2020-04-10 MED ORDER — ACETAMINOPHEN 500 MG PO TABS
1000.0000 mg | ORAL_TABLET | Freq: Once | ORAL | Status: AC
  Administered 2020-04-10: 1000 mg via ORAL
  Filled 2020-04-10: qty 2

## 2020-04-10 NOTE — ED Provider Notes (Addendum)
[facility] EMERGENCY DEPARTMENT Provider Note   CSN: [id number] Arrival date & time: 04/10/20  2102     History Chief Complaint  Patient presents with  . Dental Pain    Scott Stewart is a 29 y.o. male.  29 yo M with a chief complaints of right lower dental pain.  He has had this problem off and on for some time.  Thought to be his wisdom teeth.  Worsening over the past 48 hours or so.  Feels it is now swollen.  Is concerned that he has an abscess there.  No fevers no difficulty swallowing.  The history is provided by the patient.  Dental Pain Location:  Lower Lower teeth location:  31/RL 2nd molar Quality:  Aching and dull Severity:  Moderate Onset quality:  Gradual Duration:  2 days Timing:  Constant Progression:  Worsening Chronicity:  Recurrent Context: abscess   Relieved by:  Nothing Worsened by:  Nothing Ineffective treatments:  None tried Associated symptoms: no congestion, no facial swelling, no fever and no headaches        Past Medical History:  Diagnosis Date  . Bipolar 1 disorder ([facility])   . CHF (congestive heart failure) ([facility])   . HIV (human immunodeficiency virus infection) ([facility])   . Lupus ([facility])   . Seizures ([facility])     There are no problems to display for this patient.   Past Surgical History:  Procedure Laterality Date  . TONSILLECTOMY         No family history on file.  Social History   Tobacco Use  . Smoking status: Current Every Day Smoker    Packs/day: 0.50    Types: Cigarettes  . Smokeless tobacco: Never Used  Substance Use Topics  . Alcohol use: Not Currently  . Drug use: Yes    Types: Marijuana    Home Medications Prior to Admission medications   Medication Sig Start Date End Date Taking? Authorizing Provider  acetaminophen (TYLENOL) 500 MG tablet Take 1 tablet (500 mg total) by mouth every 6 (six) hours as needed. 11/16/19   Lorelee New, PA-C  clindamycin (CLEOCIN) 150 MG capsule Take 3 capsules (450 mg  total) by mouth 3 (three) times daily for 7 days. 04/10/20 04/17/20  Melene Plan, DO  divalproex (DEPAKOTE) 500 MG DR tablet Take 1 tablet (500 mg total) by mouth 3 (three) times daily for 30 days. 02/14/19 05/02/19  Ronnie Doss A, PA-C  furosemide (LASIX) 20 MG tablet Take 1 tablet (20 mg total) by mouth daily for 30 days. 02/14/19 05/02/19  Ronnie Doss A, PA-C  lisinopril (PRINIVIL,ZESTRIL) 10 MG tablet Take 1 tablet (10 mg total) by mouth daily for 30 days. 02/14/19 05/02/19  Arlyn Dunning, PA-C  traMADol (ULTRAM) 50 MG tablet Take 1 tablet (50 mg total) by mouth every 6 (six) hours as needed. 08/25/19   Gilda Crease, MD    Allergies    Amoxil [amoxicillin] and Strawberry (diagnostic)  Review of Systems   Review of Systems  Constitutional: Negative for chills and fever.  HENT: Positive for dental problem. Negative for congestion and facial swelling.   Eyes: Negative for discharge and visual disturbance.  Respiratory: Negative for shortness of breath.   Cardiovascular: Negative for chest pain and palpitations.  Gastrointestinal: Negative for abdominal pain, diarrhea and vomiting.  Musculoskeletal: Negative for arthralgias and myalgias.  Skin: Negative for color change and rash.  Neurological: Negative for tremors, syncope and headaches.  Psychiatric/Behavioral: Negative for confusion and dysphoric  mood.    Physical Exam Updated Vital Signs BP 133/79 (BP Location: Right Arm)   Pulse 93   Temp 98.8 F (37.1 C) (Oral)   Resp 16   Ht 5\' 9"  (1.753 m)   Wt 76.2 kg   SpO2 100%   BMI 24.81 kg/m   Physical Exam Vitals and nursing note reviewed.  Constitutional:      Appearance: He is well-developed.  HENT:     Head: Normocephalic and atraumatic.     Mouth/Throat:     Comments: General poor dentition.  Patient has erosion him to the crown of his right lower second molar.  No sublingual swelling or area of fluctuance or drainage.  No posterior oropharyngeal erythema or edema.   Tolerating secretions without difficulty Eyes:     Pupils: Pupils are equal, round, and reactive to light.  Neck:     Vascular: No JVD.  Cardiovascular:     Rate and Rhythm: Normal rate and regular rhythm.     Heart sounds: No murmur. No friction rub. No gallop.   Pulmonary:     Effort: No respiratory distress.     Breath sounds: No wheezing.  Abdominal:     General: There is no distension.     Tenderness: There is no abdominal tenderness. There is no guarding or rebound.  Musculoskeletal:        General: Normal range of motion.     Cervical back: Normal range of motion and neck supple.  Skin:    Coloration: Skin is not pale.     Findings: No rash.  Neurological:     Mental Status: He is alert and oriented to person, place, and time.  Psychiatric:        Behavior: Behavior normal.     ED Results / Procedures / Treatments   Labs (all labs ordered are listed, but only abnormal results are displayed) Labs Reviewed - No data to display  EKG None  Radiology No results found.  Procedures Dental Block  Date/Time: 04/10/2020 10:24 PM Performed by: 04/12/2020, DO Authorized by: Melene Plan, DO   Consent:    Consent obtained:  Verbal   Consent given by:  Patient   Risks discussed:  Allergic reaction, hematoma, infection, intravascular injection, nerve damage, pain, swelling and unsuccessful block   Alternatives discussed:  No treatment, delayed treatment and alternative treatment Indications:    Indications: dental pain   Location:    Block type:  Inferior alveolar   Laterality:  Right Procedure details (see MAR for exact dosages):    Syringe type:  Aspirating dental syringe   Needle gauge:  27 G   Anesthetic injected:  Bupivacaine 0.5% WITH epi   Injection procedure:  Anatomic landmarks identified, anatomic landmarks palpated, introduced needle and negative aspiration for blood Post-procedure details:    Outcome:  Pain relieved   Patient tolerance of procedure:   Tolerated well, no immediate complications   (including critical care time)  Medications Ordered in ED Medications  bupivacaine-epinephrine (MARCAINE W/ EPI) 0.5% -1:200000 injection 1.8 mL (1.8 mLs Infiltration Given 04/10/20 2211)  acetaminophen (TYLENOL) tablet 1,000 mg (1,000 mg Oral Given 04/10/20 2211)  ketorolac (TORADOL) 15 MG/ML injection 15 mg (15 mg Intravenous Given 04/10/20 2212)  oxyCODONE (Oxy IR/ROXICODONE) immediate release tablet 5 mg (5 mg Oral Given 04/10/20 2211)    ED Course  I have reviewed the triage vital signs and the nursing notes.  Pertinent labs & imaging results that were available during my care  of the patient were reviewed by me and considered in my medical decision making (see chart for details).    MDM Rules/Calculators/A&P                      29 yo M with chief complaint right lower dental pain.  This is been a recurrent issue for him.  He does have some mild submandibular swelling.  No obvious signs of abscess on intraoral exam treated with a dental block here.  Follow-up with dentistry.  10:18 PM:  I have discussed the diagnosis/risks/treatment options with the patient and believe the pt to be eligible for discharge home to follow-up with Dentist. We also discussed returning to the ED immediately if new or worsening sx occur. We discussed the sx which are most concerning (e.g., sudden worsening pain, fever, inability to tolerate by mouth) that necessitate immediate return. Medications administered to the patient during their visit and any new prescriptions provided to the patient are listed below.  Medications given during this visit Medications  bupivacaine-epinephrine (MARCAINE W/ EPI) 0.5% -1:200000 injection 1.8 mL (1.8 mLs Infiltration Given 04/10/20 2211)  acetaminophen (TYLENOL) tablet 1,000 mg (1,000 mg Oral Given 04/10/20 2211)  ketorolac (TORADOL) 15 MG/ML injection 15 mg (15 mg Intravenous Given 04/10/20 2212)  oxyCODONE (Oxy IR/ROXICODONE)  immediate release tablet 5 mg (5 mg Oral Given 04/10/20 2211)     The patient appears reasonably screen and/or stabilized for discharge and I doubt any other medical condition or other [facility] requiring further screening, evaluation, or treatment in the ED at this time prior to discharge.   Final Clinical Impression(s) / ED Diagnoses Final diagnoses:  Pain, dental    Rx / DC Orders ED Discharge Orders         Ordered    clindamycin (CLEOCIN) 150 MG capsule  3 times daily     04/10/20 2213           Deno Etienne, DO 04/10/20 [place], [place], DO 04/10/20 2225

## 2020-04-10 NOTE — ED Triage Notes (Signed)
Pt c/o right lower dental pain x 1 year-worse x 1-2 week-states he has taken so many BP powders he now has abd pain-NAD-steady gait

## 2020-04-10 NOTE — Discharge Instructions (Signed)
Take 4 over the counter ibuprofen tablets 3 times a day or 2 over-the-counter naproxen tablets twice a day for pain. Also take tylenol 1000mg (2 extra strength) four times a day.    Follow up with a dentist.  Return for fever, inability to eat or drink

## 2020-06-29 ENCOUNTER — Other Ambulatory Visit: Payer: Self-pay

## 2020-06-29 ENCOUNTER — Emergency Department (HOSPITAL_COMMUNITY)
Admission: EM | Admit: 2020-06-29 | Discharge: 2020-06-29 | Disposition: A | Discharge status: Home / Self Care | Payer: Self-pay | Attending: Emergency Medicine | Admitting: Emergency Medicine

## 2020-06-29 ENCOUNTER — Encounter (HOSPITAL_COMMUNITY): Payer: Self-pay

## 2020-06-29 DIAGNOSIS — Z21 Asymptomatic human immunodeficiency virus [HIV] infection status: Secondary | ICD-10-CM | POA: Insufficient documentation

## 2020-06-29 DIAGNOSIS — R0602 Shortness of breath: Secondary | ICD-10-CM | POA: Insufficient documentation

## 2020-06-29 DIAGNOSIS — I509 Heart failure, unspecified: Secondary | ICD-10-CM | POA: Insufficient documentation

## 2020-06-29 DIAGNOSIS — R569 Unspecified convulsions: Secondary | ICD-10-CM | POA: Insufficient documentation

## 2020-06-29 DIAGNOSIS — F17210 Nicotine dependence, cigarettes, uncomplicated: Secondary | ICD-10-CM | POA: Insufficient documentation

## 2020-06-29 LAB — COMPREHENSIVE METABOLIC PANEL
ALT: 16 U/L (ref 0–44)
AST: 25 U/L (ref 15–41)
Albumin: 4.5 g/dL (ref 3.5–5.0)
Alkaline Phosphatase: 71 U/L (ref 38–126)
Anion gap: 16 — ABNORMAL HIGH (ref 5–15)
BUN: 16 mg/dL (ref 6–20)
CO2: 16 mmol/L — ABNORMAL LOW (ref 22–32)
Calcium: 9.3 mg/dL (ref 8.9–10.3)
Chloride: 103 mmol/L (ref 98–111)
Creatinine, Ser: 1.47 mg/dL — ABNORMAL HIGH (ref 0.61–1.24)
GFR calc Af Amer: >60 mL/min (ref >60)
GFR calc non Af Amer: >60 mL/min (ref >60)
Glucose, Bld: 92 mg/dL (ref 70–99)
Potassium: 3.7 mmol/L (ref 3.5–5.1)
Sodium: 135 mmol/L (ref 135–145)
Total Bilirubin: 1 mg/dL (ref 0.3–1.2)
Total Protein: 7 g/dL (ref 6.5–8.1)

## 2020-06-29 LAB — CBC WITH DIFFERENTIAL/PLATELET
Abs Immature Granulocytes: 0.02 10*3/uL (ref 0.00–0.07)
Basophils Absolute: 0 10*3/uL (ref 0.0–0.1)
Basophils Relative: 1 %
Eosinophils Absolute: 0.2 10*3/uL (ref 0.0–0.5)
Eosinophils Relative: 3 %
HCT: 49.4 % (ref 39.0–52.0)
Hemoglobin: 16.8 g/dL (ref 13.0–17.0)
Immature Granulocytes: 0 %
Lymphocytes Relative: 38 %
Lymphs Abs: 2.3 10*3/uL (ref 0.7–4.0)
MCH: 32.9 pg (ref 26.0–34.0)
MCHC: 34 g/dL (ref 30.0–36.0)
MCV: 96.7 fL (ref 80.0–100.0)
Monocytes Absolute: 0.6 10*3/uL (ref 0.1–1.0)
Monocytes Relative: 10 %
Neutro Abs: 2.9 10*3/uL (ref 1.7–7.7)
Neutrophils Relative %: 48 %
Platelets: 349 10*3/uL (ref 150–400)
RBC: 5.11 MIL/uL (ref 4.22–5.81)
RDW: 13.6 % (ref 11.5–15.5)
WBC: 6 10*3/uL (ref 4.0–10.5)
nRBC: 0 % (ref 0.0–0.2)

## 2020-06-29 LAB — CBG MONITORING, ED: Glucose-Capillary: 81 mg/dL (ref 70–99)

## 2020-06-29 MED ORDER — DROPERIDOL 2.5 MG/ML IJ SOLN
2.5000 mg | Freq: Once | INTRAMUSCULAR | Status: AC
  Administered 2020-06-29: 2.5 mg via INTRAVENOUS
  Filled 2020-06-29: qty 2

## 2020-06-29 MED ORDER — VALPROATE SODIUM 500 MG/5ML IV SOLN
30.0000 mg/kg | Freq: Once | INTRAVENOUS | Status: AC
  Administered 2020-06-29: 2250 mg via INTRAVENOUS
  Filled 2020-06-29: qty 22.5

## 2020-06-29 MED ORDER — LORAZEPAM 2 MG/ML IJ SOLN
INTRAMUSCULAR | Status: AC
  Administered 2020-06-29: 2 mg
  Filled 2020-06-29: qty 1

## 2020-06-29 MED ORDER — DIPHENHYDRAMINE HCL 50 MG/ML IJ SOLN
25.0000 mg | Freq: Once | INTRAMUSCULAR | Status: AC
  Administered 2020-06-29: 25 mg via INTRAVENOUS
  Filled 2020-06-29: qty 1

## 2020-06-29 MED ORDER — DIVALPROEX SODIUM 500 MG PO DR TAB: 500.0000 mg | DELAYED_RELEASE_TABLET | Freq: Three times a day (TID) | ORAL | 0 refills | Status: AC

## 2020-06-29 NOTE — ED Notes (Signed)
Walked patient up the hall patient did well patient is now back in bed on the monitor with call bell in reach

## 2020-06-29 NOTE — ED Provider Notes (Signed)
[facility] EMERGENCY DEPARTMENT Provider Note   CSN: [id number] Arrival date & time: 06/29/20  9833     History Chief Complaint  Patient presents with  . Seizures    Scott Stewart is a 29 y.o. male.  29 yo M with a chief complaints of seizure-like activity.  EMS states that they are called initially for shortness of breath and when they arrived on the scene the patient's had at least 5 different episodes of tonic-clonic activity.  Described as extremely violent in force.  Was given 5 mg of Versed with some mild improvement.  CBG was in the 90s.  Patient had one episode immediately upon arrival.  Level 5 caveat nonverbal.  The history is provided by the patient.  Seizures Seizure activity on arrival: yes   Seizure type:  Grand mal Initial focality:  None Episode characteristics: abnormal movements and generalized shaking   Episode characteristics: no focal shaking   Postictal symptoms: confusion   Return to baseline: no   Severity:  Moderate Duration:  15 minutes Timing:  Clustered Number of seizures this episode:  5 Progression:  Unchanged Context: drug use and previous head injury   Recent head injury:  Unable to specify PTA treatment:  Midazolam History of seizures: yes        Past Medical History:  Diagnosis Date  . Bipolar 1 disorder (HCC)   . CHF (congestive heart failure) (HCC)   . HIV (human immunodeficiency virus infection) (HCC)   . Lupus (HCC)   . Seizures (HCC)     There are no problems to display for this patient.   Past Surgical History:  Procedure Laterality Date  . TONSILLECTOMY         History reviewed. No pertinent family history.  Social History   Tobacco Use  . Smoking status: Current Every Day Smoker    Packs/day: 0.50    Types: Cigarettes  . Smokeless tobacco: Never Used  Vaping Use  . Vaping Use: Never used  Substance Use Topics  . Alcohol use: Not Currently  . Drug use: Yes    Types: Marijuana    Home  Medications Prior to Admission medications   Medication Sig Start Date End Date Taking? Authorizing Provider  acetaminophen (TYLENOL) 500 MG tablet Take 1 tablet (500 mg total) by mouth every 6 (six) hours as needed. 11/16/19   Lorelee New, PA-C  divalproex (DEPAKOTE) 500 MG DR tablet Take 1 tablet (500 mg total) by mouth 3 (three) times daily. 06/29/20 07/29/20  Melene Plan, DO  furosemide (LASIX) 20 MG tablet Take 1 tablet (20 mg total) by mouth daily for 30 days. 02/14/19 05/02/19  Ronnie Doss A, PA-C  lisinopril (PRINIVIL,ZESTRIL) 10 MG tablet Take 1 tablet (10 mg total) by mouth daily for 30 days. 02/14/19 05/02/19  Arlyn Dunning, PA-C  traMADol (ULTRAM) 50 MG tablet Take 1 tablet (50 mg total) by mouth every 6 (six) hours as needed. 08/25/19   Gilda Crease, MD    Allergies    Amoxil [amoxicillin] and Strawberry (diagnostic)  Review of Systems   Review of Systems  Constitutional: Negative for chills and fever.  HENT: Negative for congestion and facial swelling.   Eyes: Negative for discharge and visual disturbance.  Respiratory: Positive for shortness of breath (reported to EMS prior to arrival).   Cardiovascular: Negative for chest pain and palpitations.  Gastrointestinal: Negative for abdominal pain, diarrhea and vomiting.  Musculoskeletal: Negative for arthralgias and myalgias.  Skin: Negative for  color change and rash.  Neurological: Positive for seizures. Negative for tremors, syncope and headaches.  Psychiatric/Behavioral: Negative for confusion and dysphoric mood.    Physical Exam Updated Vital Signs BP 114/76   Pulse 85   Temp 98.3 F (36.8 C) (Axillary)   Resp 14   SpO2 95%   Physical Exam Vitals and nursing note reviewed.  Constitutional:      Appearance: He is well-developed.  HENT:     Head: Normocephalic and atraumatic.  Eyes:     Pupils: Pupils are equal, round, and reactive to light.  Neck:     Vascular: No JVD.  Cardiovascular:     Rate and  Rhythm: Normal rate and regular rhythm.     Heart sounds: No murmur heard.  No friction rub. No gallop.   Pulmonary:     Effort: No respiratory distress.     Breath sounds: No wheezing.  Abdominal:     General: There is no distension.     Tenderness: There is no abdominal tenderness. There is no guarding or rebound.  Musculoskeletal:        General: Normal range of motion.     Cervical back: Normal range of motion and neck supple.  Skin:    Coloration: Skin is not pale.     Findings: No rash.  Neurological:     Comments: Responsive to pain.  Episodes of contraction and tonic-clonic like activity.  At one point the patient awoke and seemed somewhat confused though was immediately conversational about his prior history of seizures and that he is on Depakote.  Had another episode of shaking which he did respond to painful stimuli.     ED Results / Procedures / Treatments   Labs (all labs ordered are listed, but only abnormal results are displayed) Labs Reviewed  COMPREHENSIVE METABOLIC PANEL - Abnormal; Notable for the following components:      Result Value   CO2 16 (*)    Creatinine, Ser 1.47 (*)    Anion gap 16 (*)    All other components within normal limits  CBC WITH DIFFERENTIAL/PLATELET  RAPID URINE DRUG SCREEN, HOSP PERFORMED  CBG MONITORING, ED    EKG EKG Interpretation  Date/Time:  Saturday June 29 2020 08:48:46 EDT Ventricular Rate:  113 PR Interval:    QRS Duration: 80 QT Interval:  295 QTC Calculation: 405 R Axis:   91 Text Interpretation: Sinus tachycardia Borderline right axis deviation LVH by voltage ST elev, probable normal early repol pattern Since last tracing rate faster Otherwise no significant change Confirmed by Melene Plan [phone]) on 06/29/2020 8:57:02 AM   Radiology No results found.  Procedures Procedures (including critical care time)  Medications Ordered in ED Medications  LORazepam (ATIVAN) 2 MG/ML injection (2 mg  Given by Other 06/29/20  0853)  valproate (DEPACON) 2,250 mg in dextrose 5 % 50 mL IVPB (0 mg/kg  75 kg (Order-Specific) Intravenous Stopped 06/29/20 1135)  droperidol (INAPSINE) 2.5 MG/ML injection 2.5 mg (2.5 mg Intravenous Given 06/29/20 0955)  diphenhydrAMINE (BENADRYL) injection 25 mg (25 mg Intravenous Given 06/29/20 0955)    ED Course  I have reviewed the triage vital signs and the nursing notes.  Pertinent labs & imaging results that were available during my care of the patient were reviewed by me and considered in my medical decision making (see chart for details).    MDM Rules/Calculators/A&P  29 yo M with a chief complaints of seizure-like activity.  Initially EMS was called out for shortness of breath on the scene the patient has had multiple events.  EMS feels that they could be nonepileptic seizures.  Patient records were reviewed by me.  Most recent admission for seizure-like activity he had multiple events that were nonepileptic on EEG monitoring.  He does have a history of seizures and is supposed to be on Depakote per him.  Has not taken it in some time.  We will load him here on Depakote.  Given 2 of Ativan on arrival.  Will obtain lab work CBG.  Patient with resolution of seizure activity.  Was observed in the ED for a few hours without recurrence.  Loaded with Depakote.  Will start him back on it orally.  Neurology follow-up.  1:23 PM:  I have discussed the diagnosis/risks/treatment options with the patient and believe the pt to be eligible for discharge home to follow-up with PCP, neuro. We also discussed returning to the ED immediately if new or worsening sx occur. We discussed the sx which are most concerning (e.g., sudden worsening pain, fever, inability to tolerate by mouth) that necessitate immediate return. Medications administered to the patient during their visit and any new prescriptions provided to the patient are listed below.  Medications given during this  visit Medications  LORazepam (ATIVAN) 2 MG/ML injection (2 mg  Given by Other 06/29/20 0853)  valproate (DEPACON) 2,250 mg in dextrose 5 % 50 mL IVPB (0 mg/kg  75 kg (Order-Specific) Intravenous Stopped 06/29/20 1135)  droperidol (INAPSINE) 2.5 MG/ML injection 2.5 mg (2.5 mg Intravenous Given 06/29/20 0955)  diphenhydrAMINE (BENADRYL) injection 25 mg (25 mg Intravenous Given 06/29/20 0955)     The patient appears reasonably screen and/or stabilized for discharge and I doubt any other medical condition or other [facility] requiring further screening, evaluation, or treatment in the ED at this time prior to discharge.   Final Clinical Impression(s) / ED Diagnoses Final diagnoses:  Seizure-like activity (HCC)    Rx / DC Orders ED Discharge Orders         Ordered    divalproex (DEPAKOTE) 500 MG DR tablet  3 times daily     Discontinue  Reprint     06/29/20 1322           Melene Plan, DO 06/29/20 1323

## 2020-06-29 NOTE — Discharge Instructions (Signed)
Follow up with your neurologist. Take your medication as prescribed.

## 2020-06-29 NOTE — ED Notes (Signed)
Checked patient cbg it was 42 notified RN Steward Drone of blood sugar

## 2020-06-29 NOTE — ED Triage Notes (Signed)
Patient arrives via ems due to having seizures. Per ems, they were called due to him having seizures and SOB. On the scene the patient, post-ictal and Alert & oriented x1. Patient then had another seizure x2 on the scene. Ems gave the patient 5mg  of Versed en route. Patient arrives having another seizure on ems stretcher (~30 seconds, posturing).   Patient having some post-ictal confusion, but he acknowledges that he ran out of his Depakote for seizures.

## 2020-06-29 NOTE — ED Notes (Signed)
Got patient undress on the monitor did ekg shown to Dr Adela Lank patient is resting with nurse at bedside and call bell in reach

## 2020-09-05 ENCOUNTER — Telehealth: Payer: Self-pay | Admitting: Infectious Diseases

## 2020-09-05 ENCOUNTER — Emergency Department (HOSPITAL_BASED_OUTPATIENT_CLINIC_OR_DEPARTMENT_OTHER): Payer: 59

## 2020-09-05 ENCOUNTER — Other Ambulatory Visit: Payer: Self-pay

## 2020-09-05 ENCOUNTER — Encounter (HOSPITAL_BASED_OUTPATIENT_CLINIC_OR_DEPARTMENT_OTHER): Payer: Self-pay | Admitting: Emergency Medicine

## 2020-09-05 ENCOUNTER — Emergency Department (HOSPITAL_BASED_OUTPATIENT_CLINIC_OR_DEPARTMENT_OTHER)
Admission: EM | Admit: 2020-09-05 | Discharge: 2020-09-05 | Disposition: A | Discharge status: Home / Self Care | Payer: 59 | Attending: Emergency Medicine | Admitting: Emergency Medicine

## 2020-09-05 DIAGNOSIS — R369 Urethral discharge, unspecified: Secondary | ICD-10-CM | POA: Insufficient documentation

## 2020-09-05 DIAGNOSIS — R112 Nausea with vomiting, unspecified: Secondary | ICD-10-CM | POA: Insufficient documentation

## 2020-09-05 DIAGNOSIS — U071 COVID-19: Secondary | ICD-10-CM | POA: Diagnosis not present

## 2020-09-05 DIAGNOSIS — I509 Heart failure, unspecified: Secondary | ICD-10-CM | POA: Insufficient documentation

## 2020-09-05 DIAGNOSIS — Z711 Person with feared health complaint in whom no diagnosis is made: Secondary | ICD-10-CM

## 2020-09-05 DIAGNOSIS — R300 Dysuria: Secondary | ICD-10-CM | POA: Diagnosis not present

## 2020-09-05 DIAGNOSIS — Z202 Contact with and (suspected) exposure to infections with a predominantly sexual mode of transmission: Secondary | ICD-10-CM | POA: Insufficient documentation

## 2020-09-05 DIAGNOSIS — R1084 Generalized abdominal pain: Secondary | ICD-10-CM | POA: Insufficient documentation

## 2020-09-05 DIAGNOSIS — Z79899 Other long term (current) drug therapy: Secondary | ICD-10-CM | POA: Diagnosis not present

## 2020-09-05 DIAGNOSIS — Z20822 Contact with and (suspected) exposure to covid-19: Secondary | ICD-10-CM

## 2020-09-05 DIAGNOSIS — F17210 Nicotine dependence, cigarettes, uncomplicated: Secondary | ICD-10-CM | POA: Insufficient documentation

## 2020-09-05 DIAGNOSIS — R05 Cough: Secondary | ICD-10-CM | POA: Diagnosis present

## 2020-09-05 LAB — URINALYSIS, ROUTINE W REFLEX MICROSCOPIC
Bilirubin Urine: NEGATIVE
Glucose, UA: NEGATIVE mg/dL
Ketones, ur: 15 mg/dL — AB
Nitrite: NEGATIVE
Protein, ur: 30 mg/dL — AB
Specific Gravity, Urine: >1.03 — ABNORMAL HIGH (ref 1.005–1.030)
pH: 6 (ref 5.0–8.0)

## 2020-09-05 LAB — URINALYSIS, MICROSCOPIC (REFLEX): WBC, UA: >50 WBC/hpf (ref 0–5)

## 2020-09-05 LAB — SARS CORONAVIRUS 2 BY RT PCR (HOSPITAL ORDER, PERFORMED IN ~~LOC~~ HOSPITAL LAB): SARS Coronavirus 2: POSITIVE — AB

## 2020-09-05 MED ORDER — ONDANSETRON 4 MG PO TBDP: 4.0000 mg | ORAL_TABLET | Freq: Three times a day (TID) | ORAL | 0 refills | Status: AC | PRN

## 2020-09-05 MED ORDER — DOXYCYCLINE HYCLATE 100 MG PO TABS
100.0000 mg | ORAL_TABLET | Freq: Once | ORAL | Status: AC
  Administered 2020-09-05: 100 mg via ORAL
  Filled 2020-09-05: qty 1

## 2020-09-05 MED ORDER — ONDANSETRON 4 MG PO TBDP
4.0000 mg | ORAL_TABLET | Freq: Once | ORAL | Status: AC
  Administered 2020-09-05: 4 mg via ORAL
  Filled 2020-09-05: qty 1

## 2020-09-05 MED ORDER — DOXYCYCLINE HYCLATE 100 MG PO CAPS: 100.0000 mg | ORAL_CAPSULE | Freq: Two times a day (BID) | ORAL | 0 refills | Status: AC

## 2020-09-05 MED ORDER — CEFTRIAXONE SODIUM 500 MG IJ SOLR
500.0000 mg | Freq: Once | INTRAMUSCULAR | Status: AC
  Administered 2020-09-05: 500 mg via INTRAMUSCULAR
  Filled 2020-09-05: qty 500

## 2020-09-05 NOTE — Discharge Instructions (Addendum)
Your COVID test came back positive. This is likely contributing to your symptoms.  As we discussed, you will feel tired, fatigue.  This is normal for Covid.  You can take Tylenol and ibuprofen for fevers or aches.  Additionally, provided you some Zofran for nausea and vomiting.  You will need to quarantine.  You should make sure you are getting plenty of rest and staying hydrated.  If at any point, you feel you are having worsening trouble breathing, persistent vomiting/unable to keep anything down or any other worsening concerning symptoms, return to emergency department.  You have been treated today for an STD. Take antibiotics as directed. Please take all of your antibiotics until finished.  The test results with take 2-3 days to return. If there is an abnormal result, you will be notified. If you do not hear anything, that means the results were negative. You can also log on MyChart to see the results.   Your sexual partner needs to be treated too. Do not have sexual intercourse for the next 7 days and after your partner has been treated.   Follow-up with your primary care doctor in 2-4 days. If you do not have a primary care doctor, you can use one listed in the paperwork.   Return to the Emergency Department for any fever, abdominal pain, difficulty breathing, nausea/vomiting or any other worsening or concerning symptoms.

## 2020-09-05 NOTE — ED Provider Notes (Signed)
[facility] EMERGENCY DEPARTMENT Provider Note   CSN: [id number] Arrival date & time: 09/05/20  7253     History Chief Complaint  Patient presents with   Generalized Body Aches    Scott Stewart is a 29 y.o. male positives are bipolar 1, CHF, HIV, lupus, seizures who presents today for evaluation of generalized body aches, fever, cough, fatigue, sore throat.  He also reports he has had burning with urination and penile discharge.  He states his symptoms have been ongoing for about 6 days.  His fever has been up to 101.  He states he feels tired and achy.  He has not noted any hematuria.  He states cough is productive of phlegm.  He has not had any chest pain or difficulty breathing.  He states he has started to have some generalized abdominal pain as well as diarrhea.  He reports yesterday he had 2 episodes of vomiting.  No blood in emesis or diarrhea.  He states he is currently sexually active with 1 partner.  They do not use protection.  He has been treated for STDs previously.  He has not gotten Covid vaccinated.  He denies any known Covid exposure.  He has a history of HIV.  He states he is not on any medications.  He states that he follows with ID in [place].  He states that he saw them 6 months ago and everything was fine.  The history is provided by the patient.       Past Medical History:  Diagnosis Date   Bipolar 1 disorder (HCC)    CHF (congestive heart failure) (HCC)    HIV (human immunodeficiency virus infection) (HCC)    Lupus (HCC)    Seizures (HCC)     There are no problems to display for this patient.   Past Surgical History:  Procedure Laterality Date   TONSILLECTOMY         No family history on file.  Social History   Tobacco Use   Smoking status: Current Every Day Smoker    Packs/day: 0.50    Types: Cigarettes   Smokeless tobacco: Never Used  Vaping Use   Vaping Use: Never used  Substance Use Topics   Alcohol use: Not  Currently   Drug use: Yes    Types: Marijuana    Home Medications Prior to Admission medications   Medication Sig Start Date End Date Taking? Authorizing Provider  acetaminophen (TYLENOL) 500 MG tablet Take 1 tablet (500 mg total) by mouth every 6 (six) hours as needed. 11/16/19   Lorelee New, PA-C  divalproex (DEPAKOTE) 500 MG DR tablet Take 1 tablet (500 mg total) by mouth 3 (three) times daily. 06/29/20 07/29/20  Melene Plan, DO  doxycycline (VIBRAMYCIN) 100 MG capsule Take 1 capsule (100 mg total) by mouth 2 (two) times daily for 7 days. 09/05/20 09/12/20  Maxwell Caul, PA-C  furosemide (LASIX) 20 MG tablet Take 1 tablet (20 mg total) by mouth daily for 30 days. 02/14/19 05/02/19  Ronnie Doss A, PA-C  lisinopril (PRINIVIL,ZESTRIL) 10 MG tablet Take 1 tablet (10 mg total) by mouth daily for 30 days. 02/14/19 05/02/19  Arlyn Dunning, PA-C  traMADol (ULTRAM) 50 MG tablet Take 1 tablet (50 mg total) by mouth every 6 (six) hours as needed. 08/25/19   Gilda Crease, MD    Allergies    Amoxil [amoxicillin] and Strawberry (diagnostic)  Review of Systems   Review of Systems  Constitutional: Positive for  fatigue and fever.  Respiratory: Positive for cough. Negative for shortness of breath.   Cardiovascular: Negative for chest pain.  Gastrointestinal: Positive for abdominal pain, diarrhea, nausea and vomiting.  Genitourinary: Positive for discharge and dysuria. Negative for hematuria, penile swelling, scrotal swelling and testicular pain.  Musculoskeletal: Positive for myalgias.  Neurological: Negative for headaches.  All other systems reviewed and are negative.   Physical Exam Updated Vital Signs BP 122/88    Pulse 89    Temp 99 F (37.2 C) (Oral)    Resp 20    Ht 5\' 6"  (1.676 m)    Wt 73 kg    SpO2 98%    BMI 25.99 kg/m   Physical Exam Vitals and nursing note reviewed.  Constitutional:      Appearance: Normal appearance. He is well-developed.  HENT:     Head:  Normocephalic and atraumatic.     Mouth/Throat:     Comments: Posterior oropharynx is clear.  Eyes:     General: Lids are normal.     Conjunctiva/sclera: Conjunctivae normal.     Pupils: Pupils are equal, round, and reactive to light.  Cardiovascular:     Rate and Rhythm: Normal rate and regular rhythm.     Pulses: Normal pulses.     Heart sounds: Normal heart sounds. No murmur heard.  No friction rub. No gallop.   Pulmonary:     Effort: Pulmonary effort is normal.     Breath sounds: Normal breath sounds.     Comments: Lungs clear to auscultation bilaterally.  Symmetric chest rise.  No wheezing, rales, rhonchi. Abdominal:     Palpations: Abdomen is soft. Abdomen is not rigid.     Tenderness: There is generalized abdominal tenderness. There is no guarding.     Comments: Abdomen soft, nondistended.  Generalized tenderness noted diffusely with no focal point.  No rigidity, guarding.  Genitourinary:    Penis: Circumcised. Discharge present.      Testes: Normal.        Right: Tenderness or swelling not present.        Left: Tenderness or swelling not present.     Comments: The exam was performed with a chaperone present ). Normal male genitalia. No evidence of rash, ulcers or lesions.  No discharge noted from urethral tic.  No tenderness palpation of the bilateral testicles.  No overlying warmth, erythema. Musculoskeletal:        General: Normal range of motion.     Cervical back: Full passive range of motion without pain.  Skin:    General: Skin is warm and dry.     Capillary Refill: Capillary refill takes less than 2 seconds.  Neurological:     Mental Status: He is alert and oriented to person, place, and time.  Psychiatric:        Speech: Speech normal.     ED Results / Procedures / Treatments   Labs (all labs ordered are listed, but only abnormal results are displayed) Labs Reviewed  SARS CORONAVIRUS 2 BY RT PCR (HOSPITAL ORDER, PERFORMED IN [facility]  LAB)  URINALYSIS, ROUTINE W REFLEX MICROSCOPIC  GC/CHLAMYDIA PROBE AMP ([place]) NOT AT [facility]    EKG None  Radiology DG Chest Portable 1 View  Result Date: 09/05/2020 CLINICAL DATA:  Cough, headache, sore throat, body aches, burning with urination, fever for 6 days, history CHF, lupus, HIV, smoker EXAM: PORTABLE CHEST 1 VIEW COMPARISON:  Portable exam 1040 hours compared to 02/14/2019 FINDINGS: Normal heart  size, mediastinal contours, and pulmonary vascularity. Lungs clear. No infiltrate, pleural effusion or pneumothorax. Osseous structures unremarkable. IMPRESSION: No acute abnormalities. Electronically Signed   By: Ulyses SouthwardMark  Boles M.D.   On: 09/05/2020 10:52    Procedures Procedures (including critical care time)  Medications Ordered in ED Medications  cefTRIAXone (ROCEPHIN) injection 500 mg (has no administration in time range)  doxycycline (VIBRA-TABS) tablet 100 mg (has no administration in time range)    ED Course  I have reviewed the triage vital signs and the nursing notes.  Pertinent labs & imaging results that were available during my care of the patient were reviewed by me and considered in my medical decision making (see chart for details).    MDM Rules/Calculators/A&P                          29 year old male with possible history of HIV, CHF, bipolar who presents for evaluation of fever, body aches, cough, sore throat, dysuria, penile discharge x6 days.  He states he did not get Covid vaccinated.  No known COVID-19 exposure.  He is currently sexually active with 1 partner.  He has been treated for STDs in the past.  On initial arrival, he is afebrile, nontoxic-appearing.  Vital signs are stable.  On exam, no evidence of respiratory distress.  He does have some generalized abdominal tenderness but no focal point.  He tells me he follows with ID in [place].  He states he is not on any medications.  He states he last saw them 6 months ago and was told that everything  was fine.  I discussed with him regarding doing blood work, chest x-ray, Covid test, urine, GC/committee a.  Patient states he did not want all that done.  He states he just wanted to be tested for STDs and Covid.  I discussed with him that there could be other things going on that could be contributing to his symptoms but patient declined any further work-up here in the ED.   GU exam as document above.  Patient did have penile discharge.  I discussed with patient regarding treatment options.  Patient would like to be treated.  He has an allergy to amoxicillin.  He has tolerated Rocephin in the past.  RN informing that patient was vomiting.  Patient actively vomiting.  I discussed with him that I ordered Zofran.  I did discuss with him regarding starting a line and giving him antiemetics, IV fluids as well as checking blood work to ensure that there is no other acute abnormality that is contributing to his symptoms.  Patient declined.  Patient is Covid positive.  Discussed results with patient.  Patient has been referred to monoclonal antibody infusion clinic given his history of HIV.  I once again offered patient further evaluation and work-up.  ED but patient states he is ready go home and does not want any IV, blood work done.  Patient instructed on at home supportive care measures. Patient had ample opportunity for questions and discussion. All patient's questions were answered with full understanding. Strict return precautions discussed. Patient expresses understanding and agreement to plan.   Jeannene PatellaJackie Felker was evaluated in Emergency Department on 09/05/2020 for the symptoms described in the history of present illness. He was evaluated in the context of the global COVID-19 pandemic, which necessitated consideration that the patient might be at risk for infection with the SARS-CoV-2 virus that causes COVID-19. Institutional protocols and algorithms that pertain to  the evaluation of patients at risk for  COVID-19 are in a state of rapid change based on information released by regulatory bodies including the CDC and federal and state organizations. These policies and algorithms were followed during the patient's care in the ED.  Portions of this note were generated with Scientist, clinical (histocompatibility and immunogenetics). Dictation errors may occur despite best attempts at proofreading.   Final Clinical Impression(s) / ED Diagnoses Final diagnoses:  Concern about STD in male without diagnosis  Suspected COVID-19 virus infection    Rx / DC Orders ED Discharge Orders         Ordered    doxycycline (VIBRAMYCIN) 100 MG capsule  2 times daily        09/05/20 1121           Maxwell Caul, PA-C 09/05/20 1504    Melene Plan, DO 09/05/20 1511

## 2020-09-05 NOTE — Telephone Encounter (Signed)
Called to Discuss with patient about Covid symptoms and the use of the monoclonal antibody infusion for those with mild to moderate Covid symptoms and at a high risk of hospitalization.     Pt appears to qualify for this infusion due to co-morbid conditions and/or a member of an at-risk group in accordance with the FDA Emergency Use Authorization.    Unable to reach pt - LVM with callback number to my work cell Hotline for monoclonal treatment for COVID [phone]  He also has untreated HIV and would like to offer for him to be seen in our clinic here locally. Will route to [place] at [facility] to try to get into care here.

## 2020-09-05 NOTE — ED Triage Notes (Signed)
Headache, sore throat, body aches, burning with urination, possible STD exposure, fever since 6 days ago.

## 2020-09-06 ENCOUNTER — Other Ambulatory Visit: Payer: Self-pay | Admitting: Infectious Diseases

## 2020-09-06 ENCOUNTER — Telehealth: Payer: Self-pay | Admitting: *Deleted

## 2020-09-06 ENCOUNTER — Telehealth: Payer: Self-pay | Admitting: Infectious Diseases

## 2020-09-06 DIAGNOSIS — U071 COVID-19: Secondary | ICD-10-CM

## 2020-09-06 DIAGNOSIS — M329 Systemic lupus erythematosus, unspecified: Secondary | ICD-10-CM | POA: Insufficient documentation

## 2020-09-06 LAB — URINE CULTURE: Culture: NO GROWTH

## 2020-09-06 LAB — GC/CHLAMYDIA PROBE AMP (~~LOC~~) NOT AT ARMC
Chlamydia: NEGATIVE
Comment: NEGATIVE
Comment: NORMAL
Neisseria Gonorrhea: POSITIVE — AB

## 2020-09-06 NOTE — Telephone Encounter (Signed)
Received request from Scott Alberts, FNP to look for HIV confirmatory labs via careeverywhere, as patient came to ER 9/9 and stated he had a history of HIV but was not on medication.  RN only able to find negative HIV antibody testing December 2019 ([facility]) and December 2018 [place], [place], [place]). Per chart, patient states he has been HIV positive since age 29.  RN spoke with Janae Sauce at [facility], asked for his assistance to find proof of positivity.  Per chart, patient states he was previously in care at [facility] in [place], [place].  RN faxed to [facility] at [phone]. Andree Coss, RN

## 2020-09-06 NOTE — Telephone Encounter (Signed)
The following was discussed with Annice Pih:   Sx started 9/06 Qualifiers: immunosuppressed   The address for the infusion clinic site is:  --GPS address is [address] - the parking is located near [employer] building where you will see  COVID19 Infusion feather banner marking the entrance to parking.   (see photos below)            --Enter into the 2nd entrance where the "wave, flag banner" is at the road. Turn into this 2nd entrance and immediately turn left to park in 1 of the 5 parking spots.   --Please stay in your car and call the desk for assistance inside [phone].   --Average time in department is roughly 2 hours for Regener[employer] treatment - this includes preparation of the medication, IV start and the required 1 hour monitoring after the infusion.    Should you develop worsening shortness of breath, chest pain or severe breathing problems please do not wait for this appointment and go to the Emergency room for evaluation and treatment. You will undergo another oxygen screen before your infusion to ensure this is the best treatment option for you. There is a chance that the best decision may be to send you to the Emergency Room for evaluation at the time of your appointment.   The day of your visit you should: Marland Kitchen Get plenty of rest the night before and drink plenty of water . Eat a light meal/snack before coming and take your medications as prescribed  . Wear warm, comfortable clothes with a shirt that can roll-up over the elbow (will need IV start).  . Wear a mask  . Consider bringing some activity to help pass the time  Many commercial insurers are waiving bills related to COVID treatment however some have ranged from $300-640. We are starting to see some insurers send bills to patients later for the administration of the medication - we are learning more information but you may receive a bill after your appointment.  Please contact your insurance agent to discuss  prior to your appointment if you would like further details about billing specific to your policy.    I hope this helps find you feeling better,  [name]

## 2020-09-06 NOTE — Progress Notes (Signed)
I connected by phone with Scott Stewart on 09/06/2020 at 2:37 PM to discuss the potential use of a new treatment for mild to moderate COVID-19 viral infection in non-hospitalized patients.  This patient is a 29 y.o. male that meets the FDA criteria for Emergency Use Authorization of COVID monoclonal antibody casirivimab/imdevimab.  Has a (+) direct SARS-CoV-2 viral test result  Has mild or moderate COVID-19   Is NOT hospitalized due to COVID-19  Is within 10 days of symptom onset  Has at least one of the high risk factor(s) for progression to severe COVID-19 and/or hospitalization as defined in EUA.  Specific high risk criteria : BMI > 25 and Immunosuppressive Disease or Treatment   I have spoken and communicated the following to the patient or parent/caregiver regarding COVID monoclonal antibody treatment:  1. FDA has authorized the emergency use for the treatment of mild to moderate COVID-19 in adults and pediatric patients with positive results of direct SARS-CoV-2 viral testing who are 26 years of age and older weighing at least 40 kg, and who are at high risk for progressing to severe COVID-19 and/or hospitalization.  2. The significant known and potential risks and benefits of COVID monoclonal antibody, and the extent to which such potential risks and benefits are unknown.  3. Information on available alternative treatments and the risks and benefits of those alternatives, including clinical trials.  4. Patients treated with COVID monoclonal antibody should continue to self-isolate and use infection control measures (e.g., wear mask, isolate, social distance, avoid sharing personal items, clean and disinfect "high touch" surfaces, and frequent handwashing) according to CDC guidelines.   5. The patient or parent/caregiver has the option to accept or refuse COVID monoclonal antibody treatment.  After reviewing this information with the patient, The patient agreed to proceed with  receiving casirivimab\imdevimab infusion and will be provided a copy of the Fact sheet prior to receiving the infusion. [name] 09/06/2020 2:37 PM

## 2020-09-08 ENCOUNTER — Ambulatory Visit (HOSPITAL_COMMUNITY): Payer: 59 | Attending: Pulmonary Disease

## 2020-12-20 ENCOUNTER — Emergency Department (HOSPITAL_BASED_OUTPATIENT_CLINIC_OR_DEPARTMENT_OTHER)
Admission: EM | Admit: 2020-12-20 | Discharge: 2020-12-20 | Disposition: A | Discharge status: Home / Self Care | Payer: 59 | Attending: Emergency Medicine | Admitting: Emergency Medicine

## 2020-12-20 ENCOUNTER — Other Ambulatory Visit: Payer: Self-pay

## 2020-12-20 ENCOUNTER — Encounter (HOSPITAL_BASED_OUTPATIENT_CLINIC_OR_DEPARTMENT_OTHER): Payer: Self-pay

## 2020-12-20 DIAGNOSIS — Z21 Asymptomatic human immunodeficiency virus [HIV] infection status: Secondary | ICD-10-CM | POA: Insufficient documentation

## 2020-12-20 DIAGNOSIS — Z79899 Other long term (current) drug therapy: Secondary | ICD-10-CM | POA: Diagnosis not present

## 2020-12-20 DIAGNOSIS — F17210 Nicotine dependence, cigarettes, uncomplicated: Secondary | ICD-10-CM | POA: Diagnosis not present

## 2020-12-20 DIAGNOSIS — Y92410 Unspecified street and highway as the place of occurrence of the external cause: Secondary | ICD-10-CM | POA: Insufficient documentation

## 2020-12-20 DIAGNOSIS — I509 Heart failure, unspecified: Secondary | ICD-10-CM | POA: Diagnosis not present

## 2020-12-20 DIAGNOSIS — S3992XA Unspecified injury of lower back, initial encounter: Secondary | ICD-10-CM | POA: Diagnosis present

## 2020-12-20 DIAGNOSIS — S39012A Strain of muscle, fascia and tendon of lower back, initial encounter: Secondary | ICD-10-CM | POA: Diagnosis not present

## 2020-12-20 MED ORDER — IBUPROFEN 400 MG PO TABS
600.0000 mg | ORAL_TABLET | Freq: Once | ORAL | Status: AC
  Administered 2020-12-20: 18:00:00 600 mg via ORAL
  Filled 2020-12-20: qty 1

## 2020-12-20 MED ORDER — IBUPROFEN 600 MG PO TABS: 600.0000 mg | ORAL_TABLET | Freq: Three times a day (TID) | ORAL | 0 refills | Status: DC | PRN
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
## 2021-05-20 IMAGING — DX DG CHEST 1V PORT
1 series · 1 of 1 positions shown · non-contrast
Comparison: Portable exam 5949 hours compared to 02/14/2019

CLINICAL DATA: Cough, headache, sore throat, body aches, burning
with urination, fever for 6 days, history CHF, lupus, HIV, smoker

EXAM:
PORTABLE CHEST 1 VIEW

[chest ap]
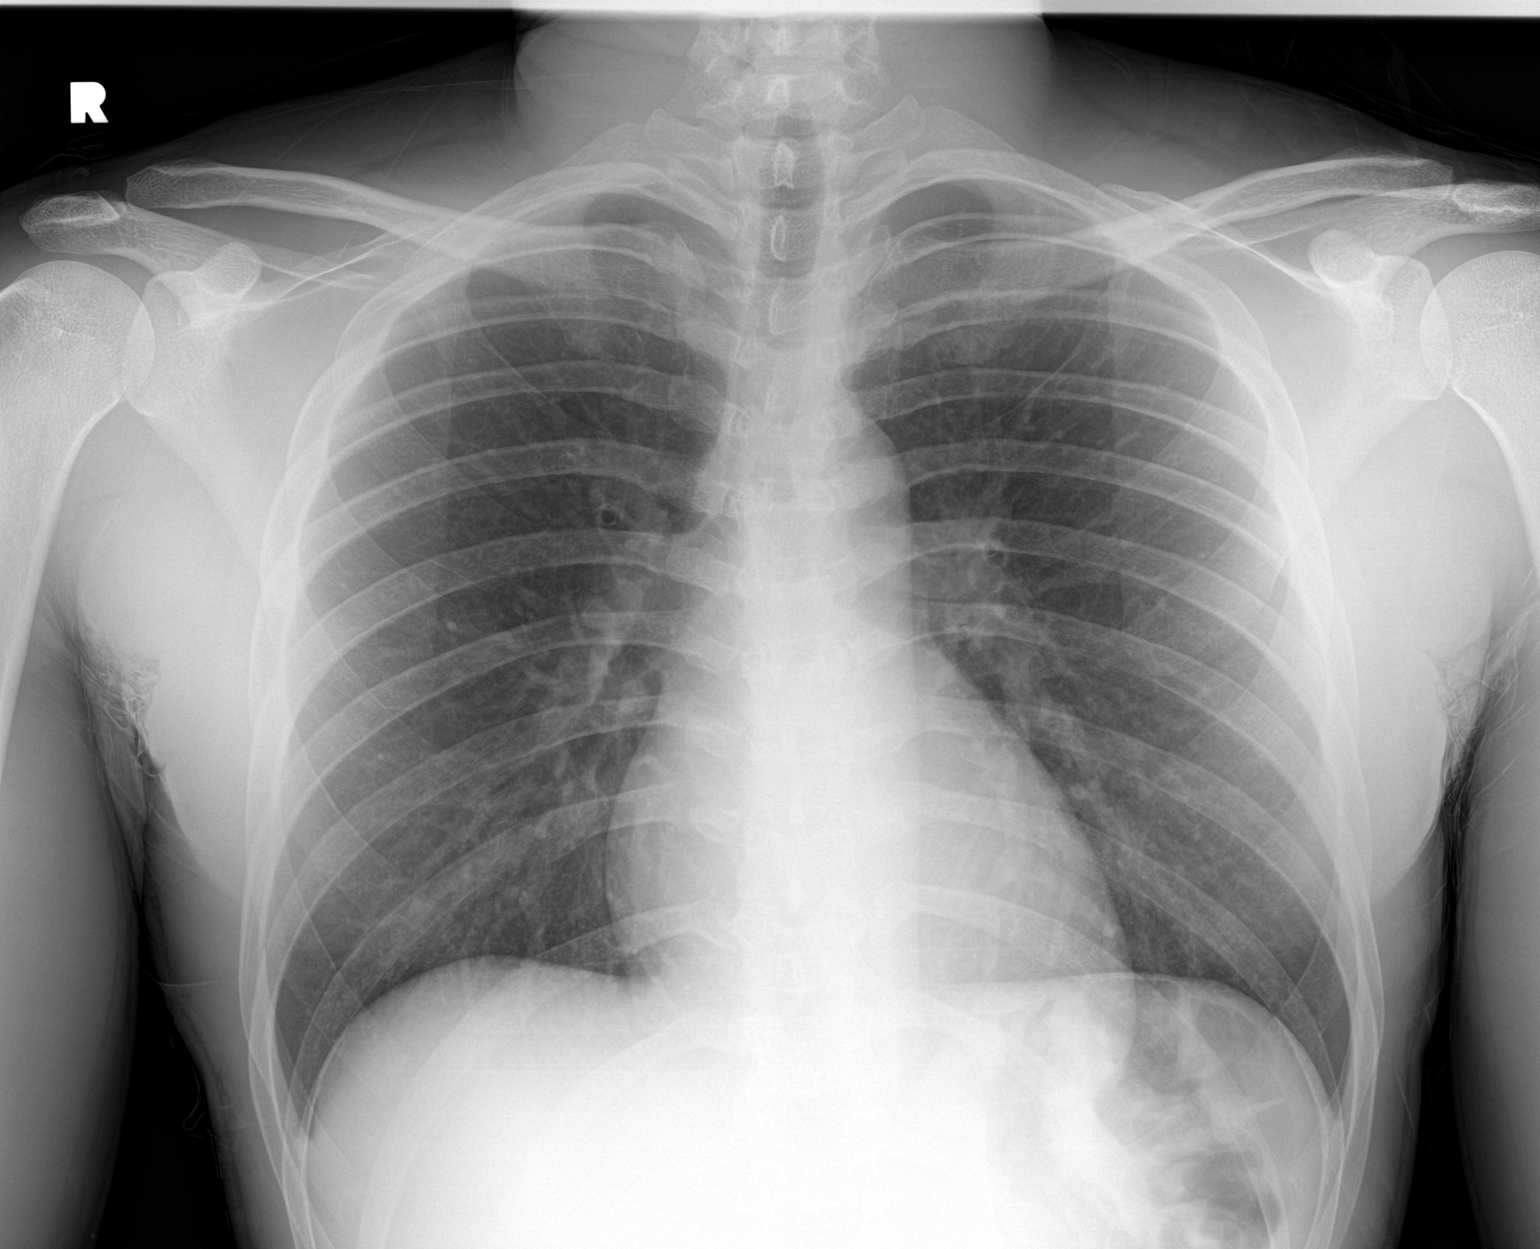

[1 of 1 positions shown; findings below may reference images not displayed]

FINDINGS: Normal heart size, mediastinal contours, and pulmonary vascularity.

Lungs clear.

No infiltrate, pleural effusion or pneumothorax.

Osseous structures unremarkable.
IMPRESSION: No acute abnormalities.

## 2022-05-11 NOTE — ED Provider Notes (Signed)
Formatting of this note is different from the original.    [facility] EMERGENCY DEPARTMENT    Time of Arrival:   05/11/22 0321    Final diagnoses:   [M54.12] Cervical radiculopathy (Primary)   [M46.803] Spondylosis of cervical spine     Medical Decision Making:      Differential Diagnosis:   Consider cervical strain, torticollis, muscle spasm, vascular issue, C spine injury, trauma, radicular pain, meningitis, PTA or deep space infection.     Social Determinants of Health:  no primary care provider  and no insurance                               Supplemental Historians include:  patient and medical records    ED Course: Patient seen and examined.   Patient is afebrile and hemodynamically stable during my initial encounter and examination.     31 year old male with left arm and neck pain, exacerbated by movement.  He has a positive Spurling test on the left, however is neurovascular intact.  Has 5/5 grip strength and full range of motion at the shoulder, elbow and wrist hand.  Reports some subjective paresthesias along will ulnar distribution of the hand.  Will check CT imaging, manage pain and reassess.    CT of the neck negative for acute process, some generative changes noted per my initial read.    Updated patient on his imaging.  Pain improved with meds here.    Suspect cervical radiculopathy in a patient with spondylosis.  No evidence concerning for myelopathy.  Will discharge home with meds as below and close follow-up with Ortho/neurosurgeon as discussed.  Advised patient with strict return precautions on signs and symptoms concerning for immediate return to the ED.  Patient knowledges plan agrees, patient stable for discharge home.          Documentation/Prior Results Review:  Old medical records, Nursing notes    Rhythm interpretation from monitor: N/A    Imaging Interpreted by me:   Degenerative changes per my initial read     CT CERVICAL SPINE W/O CONTRAST   Final Result     1. Mild kyphotic  curvature with multilevel degenerative spondylosis but no significant stenosis.   2. No acute osseous findings.     Signed By: Lind Guest, MD on 05/11/2022 4:45 AM         .     Discussion of Mangement with other Physicians, QHP or Appropriate Source:   None    .    The differential diagnosis and / or critical care lists were considered including infections, sepsis, severe sepsis, and septic shock and found unlikely unless otherwise documented in the final clinical impression or diagnosis list.     Disposition:  Home    Discharge Medication List as of 05/11/2022  6:04 AM       START taking these medications    Details   lidocaine (SALONPAS, LIDOCAINE,) 4 % Top PtMd Apply 1 Patch as directed Every 12 hours., Disp-10 Patch, R-0, Normal     methylPREDNISolone (MEDROL, PAK,) 4 mg PO DsPk Take orally  As directed on packaging., Disp-21 Tab, R-0, Normal     naproxen (NAPROSYN) 500 mg PO TABS Take 1 Tab by Mouth Twice Daily., Disp-20 Tab, R-0, Normal     oxyCODONE-acetaminophen (PERCOCET) 5-325 mg PO TABS Take 1 Tab by Mouth Every 6 Hours As Needed for up to 5 days., Disp-18  Tab, R-0, Normal         Chief Complaint   Patient presents with    ARM PAIN     Tony Mccarthy. is a 31 y.o. male who presents to the ED c/o left arm pain/neck pain that started this morning.  Patient states over the last several weeks he has had some pain and tingling in his left hand in his forearm.  Patient does work at [employer] and does a lot of heavy lifting at that was initially due to overuse.  However he woke up this morning with pain shooting from his neck all the way down to his fingertips.  The pain is worse with movement, better at rest.  Denies any headache/vision changes, weakness/loss of sensation, chest pain/shortness of breath, back pain, bowel or bladder dysfunction, perineal numbness.  He has been able to ambulate without difficulty.  Denies any history of trauma.        Review of Systems:  Constitutional:  Negative for  fever, chills and diaphoresis.   HENT:  Positive for neck pain. Negative for neck stiffness.    Respiratory:  Negative for shortness of breath.    Cardiovascular:  Negative for chest pain.   Gastrointestinal:  Negative for nausea, vomiting and abdominal pain.   Genitourinary:  Negative for dysuria, hematuria and flank pain.   Musculoskeletal:  Negative for back pain and falls.   Skin:  Negative for rash.   Neurological:  Negative for weakness, numbness and headaches.        Paresthesias to left arm   Hematological:  Negative for adenopathy.     Physical Exam  Vitals and nursing note reviewed.   Constitutional:       General: He is not in acute distress.  HENT:      Head: Normocephalic and atraumatic.      Right Ear: External ear normal.      Left Ear: External ear normal.      Mouth/Throat:      Mouth: Mucous membranes are moist.      Pharynx: Oropharynx is clear.   Eyes:      Conjunctiva/sclera: Conjunctivae normal.   Neck:      Trachea: No tracheal deviation.      Comments: + Spurling's test on left   Cardiovascular:      Rate and Rhythm: Normal rate and regular rhythm.      Pulses: Normal pulses.      Heart sounds: Normal heart sounds. No murmur heard.  Pulmonary:      Effort: Pulmonary effort is normal. No respiratory distress.      Breath sounds: Normal breath sounds.   Musculoskeletal:         General: Normal range of motion.      Cervical back: Normal range of motion and neck supple. No rigidity or tenderness.   Skin:     General: Skin is warm and dry.      Capillary Refill: Capillary refill takes less than 2 seconds.   Neurological:      General: No focal deficit present.      Mental Status: He is alert and oriented to person, place, and time.      Comments: CN II-XII intact   No focal neuro deficits   5/5 strength in bilateral upper extremities   Sensation intact         Past Medical History:   Diagnosis Date    Bipolar disorder (HCC)      Past  Surgical History:   Procedure Laterality Date    HX  TONSILLECTOMY       No family history on file.  Social History     Occupational History    Not on file   Tobacco Use    Smoking status: Every Day     Packs/day: 0.50     Types: Cigarettes    Smokeless tobacco: Not on file   Substance and Sexual Activity    Alcohol use: Yes    Drug use: No    Sexual activity: Not on file     Outpatient Medications Marked as Taking for the 05/11/22 encounter [facility](Hospital Encounter)   Medication Sig Dispense Refill    lidocaine (SALONPAS, LIDOCAINE,) 4 % Top PtMd Apply 1 Patch as directed Every 12 hours. 10 Patch 0    methylPREDNISolone (MEDROL, PAK,) 4 mg PO DsPk Take orally  As directed on packaging. 21 Tab 0    naproxen (NAPROSYN) 500 mg PO TABS Take 1 Tab by Mouth Twice Daily. 20 Tab 0    [EXPIRED] oxyCODONE-acetaminophen (PERCOCET) 5-325 mg PO TABS Take 1 Tab by Mouth Every 6 Hours As Needed for up to 5 days. 18 Tab 0     Allergies   Allergen Reactions    Amoxicillin cardiac dysrhythmia     Vital Signs:  BP 140/74   Pulse 72   Temp 98.1 F (36.7 C)   Resp 16   Wt 70.3 kg (155 lb)   SpO2 99%   BMI 25.02 kg/m      Diagnostics:  Labs:  No results found for this visit on 05/11/22.  ECG:  No results found for this visit on 05/11/22.    Medications ordered/given in the ED  Medications   oxyCODONE-acetaminophen (Percocet) 5-325 mg tablet 1 Tab (1 Tab Oral Given 05/11/22 0442)   naproxen (Naprosyn) tablet 500 mg (500 mg Oral Given 05/11/22 0442)   predniSONE (Deltasone) tablet 60 mg (60 mg Oral Given 05/11/22 0442)     I have reviewed the patient's previous charts, medical history, nursing and triage notes. I have taken this information into consideration while formulating my diagnosis and treatment plan.    Vital signs, lab work, and diagnostics during this encounter have been reviewed prior to dispo.     D/w Dr. Earney MalletPeele     Lynne M Maka, PA-C      Note:  This was dictated using a computer transcription program.  Although proofread, it may contain computer transcription errors or phonetic  errors.  Other human proofreading errors may exist.  Corrections may be performed at a later time.  Please contact us for any clarification if needed.       Electronically signed by Sydell AxonPeele, Devon, MD at 05/19/2022  6:16 AM EDT

## 2022-05-11 NOTE — ED Notes (Signed)
Formatting of this note might be different from the original.  Pain assessment on discharge was stable.  Condition stable.  Patient discharged to home.  Patient education was completed:  yes  Education taught to:  patient  Teaching method used was discussion and handout.  Understanding of teaching was good.  Patient was discharged ambulatory.  Discharged with family.  Valuables were given to: patient.    Electronically signed by Lambert Mody, RN at 05/11/2022  5:52 AM EDT

## 2022-05-11 NOTE — ED Triage Notes (Signed)
Formatting of this note might be different from the original.  Pt arrived ambulatory to triage for c/o left arm pain/tingling. Pt states it began with just his hand about a week ago and when he woke up this morning it was his entire left arm that hurt/had the tingling feeling. Pt states he has has a sore/stiff neck but thinks he slept wrong.   Electronically signed by Bing Neighbors, RN at 05/11/2022  3:27 AM EDT

## 2022-06-19 NOTE — ED Provider Notes (Signed)
Formatting of this note is different from the original.  TRIAGE CHIEF COMPLAINT:  Chief Complaint   Patient presents with    Seizure- Prior History Of Seizures     3 witnessed seizures; was outside on ground   Left hand 1st 2 digits swollen  vomiting     HPI:  Tony Mccarthy is a 33yrs, Male  who presents with complaint of having 3 brief seizures this morning and vomiting.  Patient reports he has not taken his medicine in 2 weeks.  States that he works 12-hour shifts and cannot get to the pharmacy before they closed.  Chief Complaint   Patient presents with    Seizure- Prior History Of Seizures     3 witnessed seizures; was outside on ground   Left hand 1st 2 digits swollen  vomiting     Location: Generalized  Quality: Moderate  Duration: 40 minutes  Signs and Symptoms: Nausea and vomiting, no chest pain or palpitations,  Timing:  Severity:  Context:  Modifying Factors:    Comment:    REVIEW OF SYSTEMS:    Constitutional: no fever  Eyes: no blurred vision  Respiratory: no cough, no shortness of breath  Cardiovascular: no chest pain  GI: no nausea, no vomiting, no diarrhea  Genitourinary: no dysuria  Extremeties: no myalgias  Neurologic: no weakness, no numbness  Skin: no rashes  Hematologic: no bruising, no bleeding    IMMUNIZATIONS: Up to Date.  PAST MEDICAL HISTORY:  Past Medical History:   Diagnosis Date    CHF (congestive heart failure) (CMS/HCC)     Lupus (CMS/HCC)     Seizure (CMS/HCC)      FAMILY HISTORY:  No family history on file.  SOCIAL HISTORY:  Social History     Tobacco Use    Smoking status: Some Days     Packs/day: 0.20     Types: Cigarettes    Smokeless tobacco: Never   Substance Use Topics    Alcohol use: Yes     SURGICAL HISTORY:  No past surgical history on file.  CURRENT MEDICATIONS:  Current Facility-Administered Medications   Medication Dose Route Frequency Provider Last Rate Last Admin    sodium chloride 0.9 % bolus 1,000 mL  1,000 mL Intravenous ED Candis Shine, MD        valproate  (DEPACON) 500 mg in sodium chloride 0.9 % 50 mL IVPB  500 mg Intravenous ED Candis Shine, MD 50 mL/hr at 06/19/22 0648 500 mg at 06/19/22 1610     Current Outpatient Medications   Medication Sig Dispense Refill    divalproex (DEPAKOTE ER) 500 mg Oral Tablet Sustained Release 24HR Take 1 Tablet by mouth twice a day for 30 days. 60 Tablet 0    levETIRAcetam (KEPPRA) 500 mg Oral Tablet Take 1 Tablet by mouth twice a day. 60 Tablet 0    acetaminophen (TYLENOL) 325 mg Oral Tablet Take 2 Tablets by mouth every 6 hours as needed. 30 Tablet 0    ibuprofen (ADVIL,MOTRIN) 600 mg Oral Tablet Take 1 Tablet by mouth every 6 hours as needed. 30 Tablet 0    ondansetron ODT (ZOFRAN-ODT) 4 mg Oral Tablet, Rapid Dissolve Take 1 Tablet by mouth every 8 hours as needed for Nausea. 15 Tablet 0     ALLERGIES:  Mushroom, Strawberry, and Amoxicillin    PHYSICAL EXAM:  Patient Vitals for the past 24 hrs:   BP Temp Pulse Resp SpO2 Weight   06/19/22 0717 96/68 -- -- -- -- --  06/19/22 0610 125/75 36.4 C (97.6 F) 99 (!) 24 98 % 70.3 kg (155 lb)     CONSTITUTIONAL:  Awake and alert, no obvious distress, non-toxic appearing.  Patient retching into emesis bag.  HEENT:  Atraumatic and normocephalic, PERRL, EOMI,  nares clear, oropharynx clear, no exudate, moist pink mucosa, airway patent, no lymphadenopathy, no meningismus.  CARDIOVASCULAR:  Normal rate, regular rhythm, no murmur, no rub, no gallop.  PULMONARY/CHEST:  Symmetrical and non-tender, clear to auscultation bilaterally, no wheezes, no rales, no rhonchi.  ABDOMEN:  Soft, non-distended, non-tender, no rebound, no guarding, no peritoneal signs, no masses, no organomegaly, no CVAT.  EXTREMITIES:  2+ pulses, no deformities, no clubbing, no cyanosis, no edema.  NEUROLOGICAL:  No focal neuro deficits, GCS15  SKIN:  Warm and dry, no erythema, no rash, good capillary refill.    Pertinent Labs and Imaging studies reviewed.  (See Chart for Details)  Results for orders placed or performed  during the hospital encounter of 06/19/22 (from the past 24 hour(s))   BASIC METABOLIC PANEL   Test Value Low-High    BUN 15 9 - 21 mg/dL    Sodium 062 376 - 283 mEq/L    Potassium 4.0 3.5 - 5.1 mEq/L    Chloride 108 (H) 98 - 107 mEq/L    CO2 11 (L) 22 - 29 mEq/L    Anion Gap 20 (H) 4 - 12 mEq/L    Glucose 98 70 - 105 mg/dL    Creatinine 1.51 7.61 - 1.25 mg/dL    Glomerular Filtration Rate 82 >=59 mL/Min/1.73 m2    Calcium 9.6 8.4 - 10.2 mg/dL    Osmo (Calc'd) 607 mOsm/kg    PXT:GGYIR Ratio 12.40    MAGNESIUM   Test Value Low-High    Magnesium 2.0 1.6 - 2.6 mg/dL     MEDICAL DECISION MAKING:  I reviewed the past medical, family and social history sections; including the medications, nurses notes, VS,  and allergies listed in the above record.   The last time patient was seen in the ED he reportedly had not taken his medicine in over a month.  He was seen here last August and his Depakote and Keppra levels were undetectable.  Patient obviously has a history of being noncompliant with his medications.  Patient was informed that he should have his prescriptions moved to a pharmacy that is 24 hours.  ED COURSE/PLAN: Depakote and Keppra levels were sent but they have both send outs.  Patient was given IV fluids, IV Zofran, IV Ativan for seizure activity in the emergency department.  He was also given 1000 mg of Depakote p.o. and 1 g of Keppra IV in the ED.    [phone] was -patient was noted to have a brief tonic-clonic seizure type activity within seconds of his activity stopping he asked for a glass of water.  Patient may be having actual seizures but it seems as though he is having some pseudoseizures as well.  Patient's Depakote was changed to IV instead of p.o.  Patient had total of 8 seizures this morning.  3 prior to arrival to 5 in the ED.  He meets criteria for status epilepticus.  Discussed patient with Dr. Isidore Moos and he will admit.      SNOMED CT(R)   1. Status epilepticus (CMS/HCC)  STATUS EPILEPTICUS   2. Seizure  disorder (CMS/HCC)  SEIZURE DISORDER   3. Non compliance w medication regimen  NONCOMPLIANCE WITH MEDICATION REGIMEN  Electronically signed by Guadalupe Dawn, MD at 06/19/2022  7:42 AM EDT
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
## 2022-06-19 NOTE — Progress Notes (Signed)
Formatting of this note might be different from the original.    Problem: Fall Injury Risk  Goal: Absence of Fall and Fall-Related Injury  Outcome: Ongoing, Progressing    Problem: Adult Inpatient Plan of Care  Goal: Plan of Care Review  Outcome: Ongoing, Progressing  Goal: Patient-Specific Goal (Individualized)  Outcome: Ongoing, Progressing  Goal: Absence of Hospital-Acquired Illness or Injury  Outcome: Ongoing, Progressing  Goal: Optimal Comfort and Wellbeing  Outcome: Ongoing, Progressing  Goal: Readiness for Transition of Care  Outcome: Ongoing, Progressing    Electronically signed by Alda Ponder, RN at 06/19/2022  7:55 PM EDT

## 2022-06-19 NOTE — Progress Notes (Signed)
Formatting of this note might be different from the original.  Visit from family member of this pt this AM. He was very upset and worried for pt. He asked to have him psych evaluated if possible as he has mentioned pt is under much stress and has been becoming more depressed since passing of elderly family member.     Family member mentioned IVC before seizure activity started.    Will continue to  monitor.  Electronically signed by Deatra James, RN at 06/19/2022 10:46 AM EDT

## 2022-06-19 NOTE — ED Notes (Signed)
Formatting of this note might be different from the original.  0618- Pt triaged and witnessed seizure shortly after arrival; Ativan given.  5102- Pt with 2 more witnessed seizures while staff in room along with vomiting; provider made aware. IV Keppra infusing.  Electronically signed by Marica Otter, RN at 06/19/2022  6:26 AM EDT

## 2022-06-19 NOTE — ED Notes (Signed)
Formatting of this note might be different from the original.  [phone]: Patient having grand mal seizure, foaming at mouth, body and extremities stiff, shaking. Dr Roseanne Reno notified of same. Orders received for Ativan 2 mg IV. Same administered by IXL Moore, RN.   Electronically signed by Marica Otter, RN at 06/19/2022  7:21 AM EDT

## 2022-06-19 NOTE — Progress Notes (Signed)
Formatting of this note might be different from the original.  Pt to room 3 from ed via stretcher, monitored with RN.   PT transferred to ICU bed with no issues to note. Pt woke to name looked around the room and feel back to sleep.  Fluid started on pt and lovenox administered to pt via MD orders. PT made aware of entire process.    Pt currently resting, SR/SB, BP WNL, 2L NC.   Will continue to monitor.  Electronically signed by Deatra James, RN at 06/19/2022 10:44 AM EDT
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
## 2022-06-20 NOTE — Unmapped (Signed)
Formatting of this note is different from the original.  Images from the original note were not included.      [id number]   How Seizures Affect the Body   The brain is your body?s control center. It manages everything from movement and balance to emotions and memory. When a seizure happens, some or all brain functions are affected for a short time.   The brain sends signals   The brain sends electrical signals throughout your body. Signals sent from each part of your brain controls a different body function. For example, one part of your brain controls balance. Another part controls speech. A healthcare provider can record your brain signals using a test called an electroencephalogram (EEG).     Normal EEG.      The brain during a seizure   During a seizure, a lot of abnormal electrical signals occur in your brain. They disrupt its normal activity. The way this affects your body depends on 2 main factors:     Where the seizure happens in your brain.  For example, a seizure in a part of your brain that controls movement (the motor cortex) might cause your arm or leg to jerk.     The spread of the seizure to other parts of the brain.  For example, a seizure that affects more of your brain may affect more of your body.     Types of seizures   The types of seizures include:     Focal seizures. This is when the abnormal electrical activity starts in one part of the brain. These seizures used to be known as partial seizures.     Generalized seizures. These are seizures that start on both sides of the brain at the same time.     Unknown onset. This is when it isn't known if the seizure is focal or generalized. It may be hard to know which type until tests such as an EEG are done.     Focal to bilateral seizure. This is when a seizure starts in one side or part of the brain (focal) and then spreads to both sides.     Partial seizure EEG      Generalized seizure EEG      Focal seizures   Focal seizures come in different  types. The type depends on any change in awareness. They are:     Focal aware. The person having a seizure is awake and aware of their surroundings. They may be unable to talk during the seizure.     Focal impaired awareness. People with this type of seizure will have temporary loss of awareness. This can be very brief (few seconds) to much longer. This used to be known as a complex partial seizure.     Awareness unknown. This is when it can't be determined if the person's awareness is affected or not.   Focal seizures also come in types that describe movements:     Focal motor seizure. This is when the focal seizure causes abnormal movements. The movements can include twitching, jerking, or stiffening of a body part. Or they can be movements such as licking lips, rubbing hands, walking, or running.     Focal non-motor seizure.  These don't cause movements. But the person may have vision changes, thoughts, or feelings from the seizure.     Generalized seizures   There are 2 kinds of generalized seizures:     Generalized motor seizure. This is when a seizure happens  on both sides of the brain and causes movements such as stiffening or jerking. It's also called a tonic-clonic seizure.     Generalized non-motor seizure. This is a seizure that affects awareness. It doesn't cause motor symptoms. Some people will have minor movements that repeat, such as blinking or staring. This is also called an absence seizure.     Other effects of seizures on your body    After a generalized motor seizure, you may feel soreness in your muscles when you wake up. Some people bite their tongue during a seizure. Some lose control of their bladder or bowels (incontinence). You may be sweating and have changes in your skin color. If you have seizures in your sleep, you may only know because you feel sore when you wake up. Or you may find you had incontinence while asleep.   Seizures can affect your heart rate, blood pressure, or  other vital signs. These changes are often short term (temporary). They get better after the seizure stops. During a seizure, brain cells can be injured, especially with longer seizures. For these reasons, it's important to have good control of seizures.   Seizures that affect your movements or awareness can pose a danger. When a seizure affects awareness, you may be unable to focus on what you're doing. You may lose control of a vehicle or heavy machinery.   Motor seizures (generalized or focal) can lead to injury. With a generalized motor seizure, you can fall and be injured. The motor movements with focal or generalized seizures can cause injury if you're near something dangerous, like a hot stove or a sharp object.   People with seizures that affect awareness or motor function need to take care to prevent injuries. Your ability to drive may be affected. Your driving may be limited. This is based on your provider's advice and local laws. You may need not to swim or shower alone. This is because of the risk of drowning during a seizure. Being up high, such as on a ladder, can lead to serious injury if you have a seizure. Your provider can help you learn what to do to stay safe.     Last Reviewed Date: 07/28/2020    2000-2023 [employer], [place]. All rights reserved. This information is not intended as a substitute for professional medical care. Always follow your healthcare professional's instructions.       Electronically signed by Desma Maxim, LPN at 13/07/6577  1:19 PM EDT

## 2022-06-20 NOTE — Unmapped (Signed)
Formatting of this note is different from the original.  Images from the original note were not included.      [id number]   Safety During a Seizure   Safety during a seizure   Let family and friends know what to expect and how to react when you have a seizure. This helps keep them calm and you safe. All seizures should be treated with care. But seizures that cause you to lose consciousness (tonic-clonic seizures) need more attention. Think about wearing a medical alert bracelet in case you are not around family members. This can alert other people to your condition and provide any special instructions. Here are some tips for loved ones.     What to know   Seizures typically last less than 3 minutes. But it will feel like it's longer. People recover safely from most seizures. During a tonic-clonic seizure, the person may appear to stop breathing or turn slightly blue. This may be scary for you, but try to stay calm. Afterward, the person may be tired, confused, and achy. They may need to sleep for several hours to fully recover.     What to do   During any seizure, stay with the person until it's over. Note the time when the seizure starts and ends. Don?t try to stop the seizure. During a tonic-clonic seizure, also do the following:     Move hard or sharp objects out of the way.     Lay the person on a flat surface and turn them on their side.     Place a flat, soft object under their head.     Don?t try to restrain the person. Both of you could get hurt.     Don?t put anything in the person?s mouth. The person can?t swallow their tongue, and you risk breaking their teeth or being bitten.     Don?t give the person medicines during a seizure, unless you?ve been trained by a healthcare provider.     Speak quietly to the person as they recover.     There is no need to call 911 if the person has a well-known cause of the seizures (such as epilepsy) and the seizure is very typical. If you are not sure or the person's  condition is not known, call 911.     Call 911   Call 911 if any of these occur:     The seizure lasts longer than 5 minutes     The person isn't conscious between 2 seizures     Several seizures happen in a row   These things could mean the person has status epilepticus. This is a medical emergency. Hospital treatment for this condition includes benzodiazepine medicines given by IV (intravenous). A form of this medicine (a rectal diazepam gel) may be prescribed for at-home use.   Other causes of seizures and situations that need immediate medical care include:      The person has diabetes     The person has a brain infection     The person has heat exhaustion     The person is pregnant      Poisoning is known or suspected     The person has low blood sugar     A seizure happens after or during a high fever     A head injury immediately after or within a few days after the injury happened     Multiple seizures happen in a short period of time  The person stops breathing     A seizure that happens in water     The person hit their head during a seizure and becomes difficult to wake up, is vomiting, or complains of blurry vision     It's the first time a seizure happens     It's different than the typical seizures for that person     The person is difficult to arouse after the seizure     Alcohol or drug abuse      Alcohol or drug withdrawal     Last Reviewed Date: 07/28/2021    2000-2023 [employer], [place]. All rights reserved. This information is not intended as a substitute for professional medical care. Always follow your healthcare professional's instructions.       Electronically signed by Desma Maxim, LPN at 10/62/6948  1:19 PM EDT

## 2022-06-20 NOTE — Discharge Summary (Signed)
Formatting of this note is different from the original.  [facility]    HOSPITALIST  DISCHARGE SUMMARY  PATIENT: Murriel Hopper, FNP  Date of Admission: 06/19/2022                                                 Date of Discharge:  06/20/2022    Admitting Diagnosis: Status epilepticus (CMS/HCC)  Final Diagnosis: Recurrent seizures due to noncompliance  CONSULTS: None  Hospital Course/Recommendations: He came to the hospital because of seizure activity he has seizures going back to his childhood.  Its not completely clear what brought them on although he does mention an injury as a child but there is a strong family history of seizures.  Recently he has been on or is supposed to be on Keppra and Depakote but has not been taking them.  In the ED once he did wake up he told the ER doctor that he is working out of town and cannot get to the pharmacy to get his medicine.  Looking back in his record he did not get his medication even when he could.    In the ED he had 8 seizures in it was finally brought under control with Ativan, IV Keppra, IV Depacon.  His lab work was unremarkable except for an acidosis on admission likely due to the seizures.  He was placed in the ICU in order to try and keep a close eye on him.  He had no further seizures.  He was given IV Keppra initially and oral Depakote and then today I switched him to oral Keppra.  He was sleepy much of the day yesterday but today he has been more awake.    According to family and friends he has been depressed lately because of the death of his grandmother I believe.  There is mention in his record also of bipolar disorder so there has been some evidence for issues even before now.  He did admit to feeling depressed and his family really wanted him to see somebody and psychiatry was consulted but he did not really want to talk and now he is just anxious to get out of the hospital.  He is medically stable but I do not know if  he will take his medication or not.  I did order it for him in hopes that he will get it.  We will try and get him a follow-up with his family doctor as well.  Looking through his records he has moved around through [place] and [place] and been seen in many different facilities.    Review of Systems: General: malaise, weakness  Cardiovascular: negative  Respiratory: negative  Gastrointestinal: negative  Musculoskeletal: The only thing he mentions here is soreness in his right hand.  He struck it when he had a seizure prior to admission  Neurologic: negative    PHYSICAL EXAM: General Appearance: There is no sign of pain or shortness of breath   Lung: negative findings: lungs clear to auscultation  Heart: negative findings: regular rate and rhythm  Abdomen: Abdomen soft, non-tender. BS normal. No masses,  No organomegaly  Neuro.: There is no focal neurologic deficit.  He is awake alert and oriented  Psychiatric: He is upset supposedly because he asked to go outside and smoke and  no one would let him.  His nurse is not aware that he even asked anybody.  He did admit to feelings of depression but did not admit to any suicidal ideation    Pertinent Testing: White blood count 5.21, hemoglobin 14.4.  Electrolytes showed a bicarbonate of 11 on admission and 20 later on the day of admission.  Creatinine is 1.18, liver functions are normal, magnesium 2.0.  Valproic acid level is less than 12.5 Keppra level is pending.  We did x-rays right hand since it was sore but x-ray is negative    Follow up/Outpatient Testing: He is to follow-up with his primary care provider    Discharge Diagnosis:   Active Hospital Problems    Diagnosis Date Noted    Depression 06/19/2022    Noncompliance with medication regimen     Seizure disorder (CMS/HCC)      Resolved Hospital Problems    Diagnosis Date Resolved    *Status epilepticus (CMS/HCC) 06/20/2022     Medications/RX:     Medication List       CHANGE how you take  these medications        Instructions   divalproex 500 mg Tb24  Commonly known as: DEPAKOTE ER  What changed:   when to take this  Another medication with the same name was removed. Continue taking this medication, and follow the directions you see here.   Take 1 Tablet by mouth three times a day.    levETIRAcetam 1,000 mg Tab  Commonly known as: KEPPRA  What changed:   medication strength  Another medication with the same name was removed. Continue taking this medication, and follow the directions you see here.   Take 0.5 Tablets by mouth twice a day.          CONTINUE taking these medications        Instructions   acetaminophen 325 mg Tab  Commonly known as: TYLENOL   Take 2 Tablets by mouth every 6 hours as needed.    ibuprofen 600 mg Tab  Commonly known as: ADVIL,MOTRIN   Take 1 Tablet by mouth every 6 hours as needed.    ondansetron ODT 4 mg Tbdl  Commonly known as: ZOFRAN-ODT   Take 1 Tablet by mouth every 8 hours as needed for Nausea.          Diet: Regular diet    Discharge Instructions: Discharge Instructions Given  Follow-Up: follow up needed, see Orders  Discharge Procedure Orders   Regular Diet   Scheduling Instructions: Use to maintain or restore the nutrition of all patients who require little or no dietary restrictions.  Provides a minimum of 1700 calories a day.  Salt packets provided on trays.  Sodium content provided by the salt packets is not included in the menu analysis.     No Activity Restrictions       TOTAL TIME SPENT ON DISCHARGE, COUNSELING, AND COORDINATING CARE WAS  OVER 30 MINUTES    Janace Hoard, MD  06/20/2022  1:15 PM      Electronically signed by Janace Hoard, MD at 06/20/2022  1:24 PM EDT

## 2022-06-20 NOTE — Progress Notes (Signed)
Formatting of this note is different from the original.  Progress Note    Subjective:  Admit Date: 06/19/2022    Length of Stay: 1 days     Tony Mccarthy, 31yrs old, Male has complaints of : Right hand pain                         .    Assessment: There has been no further sign of seizure activity  Active Hospital Problems    Diagnosis    *Status epilepticus (CMS/HCC)-he had quite a few seizures back-to-back in the ED but this is now resolved    Depression-he does have a history of depression.  According to his family he has had some suicidal ideation but he did not admit that to me.  Family has requested psychiatric evaluation    Noncompliance with medication regimen-he has not been taking his medication for his seizures    Seizure disorder (CMS/HCC)-he has had a seizure disorder that goes back to childhood.  Currently he is supposed to be taking Keppra and Depakote     Scheduled:divalproex, 500 mg, Q8HR  enoxaparin (LOVENOX) injection, 40 mg, Q24HR  levETIRAcetam, 1,000 mg, Q 12 HOURS (0600, 1800)  LORazepam, ,   LORazepam, ,   ondansetron, ,   valproate, ,      Continuous IV Fluids/Drips: STM:[id number], 650 mg, Q6HR-PRN  ketorolac, 30 mg, Q6HR-PRN  lorazepam, 1 mg, Q2HR-PRN  ondansetron, 4 mg, Q6HR-PRN    Objective: Patient Vitals for the past 24 hrs:   BP Temp Pulse Resp SpO2 Height Weight   06/20/22 1133 115/71 37.1 C (98.8 F) 90 19 99 % -- --   06/20/22 1100 115/71 -- 79 -- 98 % -- --   06/20/22 1000 113/66 -- 86 -- 98 % -- --   06/20/22 0900 125/74 -- 94 -- 99 % -- --   06/20/22 0812 120/73 -- 83 -- 99 % -- --   06/20/22 0800 120/73 -- 83 -- 97 % -- --   06/20/22 0723 118/65 36.9 C (98.5 F) 87 17 99 % -- --   06/20/22 0700 132/76 -- 68 -- 99 % -- --   06/20/22 0600 106/54 -- 62 -- -- -- --   06/20/22 0515 -- -- 71 -- -- -- --   06/20/22 0500 -- -- 79 -- -- -- --   06/20/22 0445 -- -- 70 -- -- -- --   06/20/22 0430 -- -- 79 -- -- -- --   06/20/22 0415 118/69 -- 81 -- -- -- --   06/20/22 0410 118/65 36.5  C (97.7 F) 100 18 100 % -- --   06/20/22 0400 -- -- 76 -- -- -- --   06/20/22 0345 -- -- 71 -- -- -- --   06/20/22 0330 -- -- 72 -- -- -- --   06/20/22 0315 118/65 -- 74 -- -- -- --   06/20/22 0300 -- -- 67 -- -- -- --   06/20/22 0245 -- -- 73 -- -- -- --   06/20/22 0230 125/83 -- 71 -- -- -- --   06/20/22 0215 111/63 -- 80 -- -- -- --   06/20/22 0200 -- -- 84 -- -- -- --   06/20/22 0145 -- -- 78 -- -- -- --   06/20/22 0130 -- -- 76 -- -- -- --   06/20/22 0115 119/71 -- 68 -- -- -- --   06/20/22 0100 -- -- 72 -- -- -- --  06/20/22 0045 -- -- 67 -- 95 % -- --   06/20/22 0030 -- -- 69 -- 93 % -- --   06/20/22 0015 108/63 -- 70 -- 95 % -- --   06/20/22 0013 106/53 36.6 C (97.9 F) 70 18 100 % -- --   06/20/22 0000 -- -- 70 -- 100 % -- --   06/19/22 2345 -- -- 67 -- 98 % -- --   06/19/22 2330 -- -- 67 -- 99 % -- --   06/19/22 2315 106/53 -- 65 -- 98 % -- --   06/19/22 2300 -- -- 68 -- 98 % -- --   06/19/22 2245 -- -- 69 -- 98 % -- --   06/19/22 2230 -- -- 70 -- 98 % -- --   06/19/22 2215 100/63 -- 66 -- 98 % -- --   06/19/22 2200 -- -- 71 -- 98 % -- --   06/19/22 2145 -- -- 62 -- 98 % -- --   06/19/22 2130 -- -- 71 -- 95 % -- --   06/19/22 2115 100/56 -- 78 -- 99 % -- --   06/19/22 2100 -- -- 73 -- 97 % -- --   06/19/22 2045 -- -- 77 -- 100 % -- --   06/19/22 2030 -- -- 66 -- 97 % -- --   06/19/22 2015 117/72 -- 87 -- 99 % -- --   06/19/22 2000 -- -- 76 -- 98 % -- --   06/19/22 1945 131/87 36.5 C (97.7 F) 81 18 98 % -- --   06/19/22 1930 -- -- 77 -- 98 % -- --   06/19/22 1915 120/75 -- 78 -- 99 % -- --   06/19/22 1900 -- -- 89 -- 99 % -- --   06/19/22 1600 131/87 -- 81 -- 98 % -- --   06/19/22 1510 115/85 36.7 C (98.1 F) 76 19 98 % -- --   06/19/22 1500 115/85 -- 80 -- 97 % -- --   06/19/22 1400 101/70 -- 75 -- 98 % -- --   06/19/22 1311 93/61 36.5 C (97.7 F) 65 12 98 % 1.651 m (5\' 5" ) 65.4 kg (144 lb 3.2 oz)   06/19/22 1300 93/61 -- 71 -- 95 % -- --   06/19/22 1200 -- -- 71 -- 96 % -- --     Body mass index  is 24 kg/m.    Intake/Output Summary (Last 24 hours) at 06/20/2022 1142  Last data filed at 06/20/2022 0900  Gross per 24 hour   Intake 2286 ml   Output 900 ml   Net 1386 ml     General: malaise, fatigue, weakness  Respiratory: negative   Cardiovascular: negative  Gastrointestinal: poor appetite  Musculoskeletal: Soreness in his right hand from where he fell while having a seizure.  X-rays are negative  Neurologic: negative    Physical Exam: alert  respirations non labored  extremities with no edema  no distress noted  General Appearance: He is awake and alert and able to answer questions if he chooses to  Extremities: He has some soreness in the fingers of his right hand where he fell when he had a seizure prior to admission  Lungs: Normal breath sounds  Cardiac: Normal cardiac exam  Abdomen: Abdomen soft, no distention  Neurologic: Awake, alert, oriented times three, converses pleasantly, there is no focal deficit    Patient's medical records have been reviewed.     Plan: I will consult psychiatry  since there is apparently history of depression and possible suicidal ideation according to his family.  Keppra has been switched to oral and IV fluids have been stopped  Janace Hoard, MD  06/20/2022      Electronically signed by Janace Hoard, MD at 06/20/2022 11:45 AM EDT

## 2022-06-25 NOTE — Progress Notes (Signed)
Formatting of this note is different from the original.   06/25/2022 10:00 AM   How many times in the past year have you had 4 or more drinks in a day? NONE   How many times in the past year have you used a recreational drug or used a prescription medication for nonmedical reasons? (!) 1 OR MORE   Have you used drugs other than those required for medical reasons? No   Do you abuse more than one drug at a time? No   Are you always able to stop using drugs when you want to? Yes   Have you had "blackouts" or "flashbacks" as a result  of drug use? No   Do you ever feel bad or guilty about your drug use? No   Does your spouse (or parents) ever complain about your involvement with drugs? No   Have you neglected your family because of your use of drugs? No   Have you engaged in illegal activities in order to  obtain drugs? No   Have you ever experienced withdrawal symptoms (felt sick) when you stopped taking drugs? No   Have you had medical problems as a result of your drug use (e.g. memory loss, hepatitis, convulsions, bleeding, etc.)? No   DAST-10 Total Score (Auto Calculated) 0   Zone of Use: Zone I: Healthy   Did patient decline PHQ screening? No   Little interest or pleasure in doing things Not at all   Feeling down, depressed or hopeless [include irritable if under 18] Not at all   [facility] Score 0   Little interest or pleasure in doing things Not at all   Feeling down, depressed or hopeless [include irritable if under 18] Not at all       Electronically signed by Darnelle Catalan, CMA at 06/27/2022  7:43 AM PDT

## 2022-06-27 NOTE — Progress Notes (Signed)
Formatting of this note might be different from the original.  Hospital follow up   Recent admission for status ellipticus.  He has been travelling and unable to keep up with getting his medications refilled.  This resulted in numerous recent seizures.  He was admitted and restarted on keppra and depakote.  He has not had seizure since leaving the hospital.  Also was told he had some arthritis in his shoulder as it has been stiff and has had reduced motion.  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  He is describing L shoulder pain at about a 6/10.    Hospital records, d/c summary reviewed  Meds and problems reviewed  PE : pleasant NAD  Neuro grossly intact  S shoulder shows bicipital bursal ttp and swelling.  Joint shows crepitus, AC pain, extension to about 90 degrees before pain, abduction about 90 degrees  Procedural note  Bicipital bursal area of skin cleansed with alcohol and injected 40 mg kenalog in bursal sac  L shoulder landmarks identified, skin cleansed and shoulder joint injected with 2cc xylocaine and 40 mg kenalog, tolerated well.  A/P sz d/o: discussed importance of staying on meds which he seems to understand after this episode and doesn't think he'll be travelling for a while  L shoulder pain, h/o arthritis/bursitis: joint and bursal sac injected today as above.  Over 30 minutes spent in care of pt and reviewing records.    Electronically signed by Kern Alberta, MD at 06/27/2022  7:43 AM PDT

## 2022-11-06 ENCOUNTER — Emergency Department: Payer: PRIVATE HEALTH INSURANCE

## 2022-11-06 ENCOUNTER — Emergency Department: Admit: 2022-11-06 | Payer: PRIVATE HEALTH INSURANCE

## 2022-11-06 ENCOUNTER — Inpatient Hospital Stay
Admit: 2022-11-06 | Discharge: 2022-11-06 | Disposition: A | Discharge status: Home / Self Care | Payer: PRIVATE HEALTH INSURANCE | Attending: Emergency Medicine

## 2022-11-06 DIAGNOSIS — M436 Torticollis: Secondary | ICD-10-CM

## 2022-11-06 MED ORDER — METHOCARBAMOL 750 MG PO TABS
750 MG | ORAL_TABLET | Freq: Four times a day (QID) | ORAL | 0 refills | Status: AC
Start: 2022-11-06 — End: 2022-11-16

## 2022-11-06 NOTE — ED Notes (Signed)
Discharge instructions reviewed with patient. Patient advised to follow up as directed on discharge instructions.  Patient denies questions, needs or concerns at this time.  Patient verbalized understanding. No s/sx of distress noted.   Patient advised that MD escripted prescription for Robaxin to [facility].  Patient voiced understanding.      Burl Tauzin, Donavan Foil, RN  11/06/22 1715
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
## 2022-11-06 NOTE — ED Triage Notes (Signed)
Patient states prior hx of left shoulder injury three months ago at workplace.  He states pain to left shoulder extending into left arm.  He states that he experienced a head and neck injury three weeks ago.  Denies being evaluated s/p head and neck injury.  He states neck pain today.
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
## 2023-12-17 ENCOUNTER — Encounter: Attending: Orthopaedic Surgery

## 2024-11-23 ENCOUNTER — Encounter (HOSPITAL_COMMUNITY): Payer: Self-pay | Admitting: Emergency Medicine

## 2024-11-23 ENCOUNTER — Emergency Department (HOSPITAL_COMMUNITY): Payer: Self-pay

## 2024-11-23 ENCOUNTER — Emergency Department (HOSPITAL_COMMUNITY)
Admission: EM | Admit: 2024-11-23 | Discharge: 2024-11-23 | Disposition: A | Discharge status: Home / Self Care | Payer: Self-pay | Attending: Emergency Medicine | Admitting: Emergency Medicine

## 2024-11-23 ENCOUNTER — Other Ambulatory Visit: Payer: Self-pay

## 2024-11-23 DIAGNOSIS — L03213 Periorbital cellulitis: Secondary | ICD-10-CM | POA: Insufficient documentation

## 2024-11-23 DIAGNOSIS — I509 Heart failure, unspecified: Secondary | ICD-10-CM | POA: Insufficient documentation

## 2024-11-23 DIAGNOSIS — F17210 Nicotine dependence, cigarettes, uncomplicated: Secondary | ICD-10-CM | POA: Insufficient documentation

## 2024-11-23 DIAGNOSIS — Z21 Asymptomatic human immunodeficiency virus [HIV] infection status: Secondary | ICD-10-CM | POA: Insufficient documentation

## 2024-11-23 LAB — CBC WITH DIFFERENTIAL/PLATELET
Abs Immature Granulocytes: 0.02 K/uL (ref 0.00–0.07)
Basophils Absolute: 0.1 K/uL (ref 0.0–0.1)
Basophils Relative: 1 %
Eosinophils Absolute: 0.2 K/uL (ref 0.0–0.5)
Eosinophils Relative: 2 %
HCT: 40 % (ref 39.0–52.0)
Hemoglobin: 13.8 g/dL (ref 13.0–17.0)
Immature Granulocytes: 0 %
Lymphocytes Relative: 30 %
Lymphs Abs: 2.3 K/uL (ref 0.7–4.0)
MCH: 32.2 pg (ref 26.0–34.0)
MCHC: 34.5 g/dL (ref 30.0–36.0)
MCV: 93.2 fL (ref 80.0–100.0)
Monocytes Absolute: 0.6 K/uL (ref 0.1–1.0)
Monocytes Relative: 8 %
Neutro Abs: 4.6 K/uL (ref 1.7–7.7)
Neutrophils Relative %: 59 %
Platelets: 274 K/uL (ref 150–400)
RBC: 4.29 MIL/uL (ref 4.22–5.81)
RDW: 13.3 % (ref 11.5–15.5)
WBC: 7.8 K/uL (ref 4.0–10.5)
nRBC: 0 % (ref 0.0–0.2)

## 2024-11-23 LAB — I-STAT CHEM 8, ED
BUN: 16 mg/dL (ref 6–20)
Calcium, Ion: 1.21 mmol/L (ref 1.15–1.40)
Chloride: 104 mmol/L (ref 98–111)
Creatinine, Ser: 1.2 mg/dL (ref 0.61–1.24)
Glucose, Bld: 88 mg/dL (ref 70–99)
HCT: 42 % (ref 39.0–52.0)
Hemoglobin: 14.3 g/dL (ref 13.0–17.0)
Potassium: 3.7 mmol/L (ref 3.5–5.1)
Sodium: 139 mmol/L (ref 135–145)
TCO2: 23 mmol/L (ref 22–32)

## 2024-11-23 LAB — RESP PANEL BY RT-PCR (RSV, FLU A&B, COVID)  RVPGX2
Influenza A by PCR: NEGATIVE
Influenza B by PCR: NEGATIVE
Resp Syncytial Virus by PCR: NEGATIVE
SARS Coronavirus 2 by RT PCR: NEGATIVE

## 2024-11-23 LAB — BASIC METABOLIC PANEL WITH GFR
Anion gap: 10 (ref 5–15)
BUN: 14 mg/dL (ref 6–20)
CO2: 21 mmol/L — ABNORMAL LOW (ref 22–32)
Calcium: 8.7 mg/dL — ABNORMAL LOW (ref 8.9–10.3)
Chloride: 104 mmol/L (ref 98–111)
Creatinine, Ser: 1.14 mg/dL (ref 0.61–1.24)
GFR, Estimated: >60 mL/min (ref >60)
Glucose, Bld: 93 mg/dL (ref 70–99)
Potassium: 3.7 mmol/L (ref 3.5–5.1)
Sodium: 135 mmol/L (ref 135–145)

## 2024-11-23 MED ORDER — IBUPROFEN 600 MG PO TABS: 600.0000 mg | ORAL_TABLET | Freq: Four times a day (QID) | ORAL | 0 refills | Status: AC | PRN

## 2024-11-23 MED ORDER — ACETAMINOPHEN 325 MG PO TABS: 650.0000 mg | ORAL_TABLET | Freq: Four times a day (QID) | ORAL | 0 refills | Status: AC | PRN

## 2024-11-23 MED ORDER — ONDANSETRON HCL 4 MG/2ML IJ SOLN
4.0000 mg | Freq: Once | INTRAMUSCULAR | Status: AC
  Administered 2024-11-23: 4 mg via INTRAVENOUS
  Filled 2024-11-23: qty 2

## 2024-11-23 MED ORDER — IOHEXOL 350 MG/ML SOLN
75.0000 mL | Freq: Once | INTRAVENOUS | Status: AC | PRN
  Administered 2024-11-23: 75 mL via INTRAVENOUS

## 2024-11-23 MED ORDER — MORPHINE SULFATE (PF) 4 MG/ML IV SOLN
4.0000 mg | Freq: Once | INTRAVENOUS | Status: AC
  Administered 2024-11-23: 4 mg via INTRAVENOUS
  Filled 2024-11-23: qty 1

## 2024-11-23 MED ORDER — OXYCODONE HCL 5 MG PO TABS: 5.0000 mg | ORAL_TABLET | ORAL | 0 refills | Status: AC | PRN

## 2024-11-23 MED ORDER — CEFDINIR 300 MG PO CAPS: 300.0000 mg | ORAL_CAPSULE | Freq: Two times a day (BID) | ORAL | 0 refills | Status: AC

## 2024-11-23 MED ORDER — SODIUM CHLORIDE 0.9 % IV BOLUS
1000.0000 mL | Freq: Once | INTRAVENOUS | Status: AC
  Administered 2024-11-23: 1000 mL via INTRAVENOUS

## 2024-11-23 NOTE — ED Notes (Signed)
 X-ray at bedside.
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
## 2024-11-23 NOTE — Discharge Instructions (Addendum)
 It was a pleasure caring for you today in the emergency department.  Please take antibiotics as prescribed.  Please call ophthalmology office to arrange follow-up in the next week. Please call PCP to arrange follow up  Please return to the emergency department for any worsening or worrisome symptoms.
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
## 2024-11-27 ENCOUNTER — Emergency Department (HOSPITAL_BASED_OUTPATIENT_CLINIC_OR_DEPARTMENT_OTHER)
Admission: EM | Admit: 2024-11-27 | Discharge: 2024-11-27 | Disposition: A | Discharge status: Home / Self Care | Attending: Emergency Medicine | Admitting: Emergency Medicine

## 2024-11-27 ENCOUNTER — Emergency Department (HOSPITAL_BASED_OUTPATIENT_CLINIC_OR_DEPARTMENT_OTHER)

## 2024-11-27 ENCOUNTER — Other Ambulatory Visit (HOSPITAL_BASED_OUTPATIENT_CLINIC_OR_DEPARTMENT_OTHER): Payer: Self-pay

## 2024-11-27 ENCOUNTER — Encounter (HOSPITAL_BASED_OUTPATIENT_CLINIC_OR_DEPARTMENT_OTHER): Payer: Self-pay | Admitting: Emergency Medicine

## 2024-11-27 ENCOUNTER — Other Ambulatory Visit: Payer: Self-pay

## 2024-11-27 DIAGNOSIS — L03213 Periorbital cellulitis: Secondary | ICD-10-CM | POA: Insufficient documentation

## 2024-11-27 DIAGNOSIS — I509 Heart failure, unspecified: Secondary | ICD-10-CM | POA: Insufficient documentation

## 2024-11-27 DIAGNOSIS — Z21 Asymptomatic human immunodeficiency virus [HIV] infection status: Secondary | ICD-10-CM | POA: Insufficient documentation

## 2024-11-27 DIAGNOSIS — J340 Abscess, furuncle and carbuncle of nose: Secondary | ICD-10-CM | POA: Diagnosis not present

## 2024-11-27 DIAGNOSIS — F17200 Nicotine dependence, unspecified, uncomplicated: Secondary | ICD-10-CM | POA: Diagnosis not present

## 2024-11-27 DIAGNOSIS — R220 Localized swelling, mass and lump, head: Secondary | ICD-10-CM | POA: Diagnosis present

## 2024-11-27 LAB — CBC WITH DIFFERENTIAL/PLATELET
Abs Immature Granulocytes: 0.02 K/uL (ref 0.00–0.07)
Basophils Absolute: 0 K/uL (ref 0.0–0.1)
Basophils Relative: 1 %
Eosinophils Absolute: 0.1 K/uL (ref 0.0–0.5)
Eosinophils Relative: 1 %
HCT: 44.6 % (ref 39.0–52.0)
Hemoglobin: 15.6 g/dL (ref 13.0–17.0)
Immature Granulocytes: 0 %
Lymphocytes Relative: 26 %
Lymphs Abs: 2 K/uL (ref 0.7–4.0)
MCH: 32.3 pg (ref 26.0–34.0)
MCHC: 35 g/dL (ref 30.0–36.0)
MCV: 92.3 fL (ref 80.0–100.0)
Monocytes Absolute: 0.5 K/uL (ref 0.1–1.0)
Monocytes Relative: 7 %
Neutro Abs: 5.1 K/uL (ref 1.7–7.7)
Neutrophils Relative %: 65 %
Platelets: 341 K/uL (ref 150–400)
RBC: 4.83 MIL/uL (ref 4.22–5.81)
RDW: 13.4 % (ref 11.5–15.5)
WBC: 7.8 K/uL (ref 4.0–10.5)
nRBC: 0 % (ref 0.0–0.2)

## 2024-11-27 LAB — BASIC METABOLIC PANEL WITH GFR
Anion gap: 14 (ref 5–15)
BUN: 8 mg/dL (ref 6–20)
CO2: 23 mmol/L (ref 22–32)
Calcium: 9.7 mg/dL (ref 8.9–10.3)
Chloride: 100 mmol/L (ref 98–111)
Creatinine, Ser: 1.19 mg/dL (ref 0.61–1.24)
GFR, Estimated: >60 mL/min (ref >60)
Glucose, Bld: 99 mg/dL (ref 70–99)
Potassium: 3.6 mmol/L (ref 3.5–5.1)
Sodium: 138 mmol/L (ref 135–145)

## 2024-11-27 LAB — LACTIC ACID, PLASMA: Lactic Acid, Venous: 1.5 mmol/L (ref 0.5–1.9)

## 2024-11-27 MED ORDER — LIDOCAINE-EPINEPHRINE-TETRACAINE (LET) TOPICAL GEL
3.0000 mL | Freq: Once | TOPICAL | Status: AC
  Administered 2024-11-27: 3 mL via TOPICAL
  Filled 2024-11-27: qty 3

## 2024-11-27 MED ORDER — OXYCODONE-ACETAMINOPHEN 5-325 MG PO TABS
1.0000 | ORAL_TABLET | Freq: Once | ORAL | Status: AC
  Administered 2024-11-27: 1 via ORAL
  Filled 2024-11-27: qty 1

## 2024-11-27 MED ORDER — PENTAFLUOROPROP-TETRAFLUOROETH EX AERO
INHALATION_SPRAY | Freq: Once | CUTANEOUS | Status: AC
  Administered 2024-11-27: 30 via TOPICAL
  Filled 2024-11-27: qty 30

## 2024-11-27 MED ORDER — ONDANSETRON HCL 4 MG/2ML IJ SOLN
4.0000 mg | Freq: Once | INTRAMUSCULAR | Status: AC
  Administered 2024-11-27: 4 mg via INTRAVENOUS
  Filled 2024-11-27: qty 2

## 2024-11-27 MED ORDER — CLINDAMYCIN HCL 300 MG PO CAPS
300.0000 mg | ORAL_CAPSULE | Freq: Three times a day (TID) | ORAL | 0 refills | Status: AC
  Filled 2024-11-27: qty 21, 7d supply, fill #0

## 2024-11-27 MED ORDER — CLINDAMYCIN PHOSPHATE 600 MG/50ML IV SOLN
600.0000 mg | Freq: Once | INTRAVENOUS | Status: AC
  Administered 2024-11-27: 600 mg via INTRAVENOUS
  Filled 2024-11-27: qty 50

## 2024-11-27 MED ORDER — MORPHINE SULFATE (PF) 4 MG/ML IV SOLN
4.0000 mg | Freq: Once | INTRAVENOUS | Status: AC
  Administered 2024-11-27: 4 mg via INTRAVENOUS
  Filled 2024-11-27: qty 1

## 2024-11-27 MED ORDER — SENNOSIDES-DOCUSATE SODIUM 8.6-50 MG PO TABS
1.0000 | ORAL_TABLET | Freq: Every evening | ORAL | 0 refills | Status: AC | PRN
  Filled 2024-11-27: qty 100, 100d supply, fill #0

## 2024-11-27 MED ORDER — OXYCODONE-ACETAMINOPHEN 5-325 MG PO TABS
1.0000 | ORAL_TABLET | Freq: Four times a day (QID) | ORAL | 0 refills | Status: AC | PRN
  Filled 2024-11-27: qty 12, 3d supply, fill #0

## 2024-11-27 MED ORDER — IOHEXOL 300 MG/ML  SOLN
100.0000 mL | Freq: Once | INTRAMUSCULAR | Status: AC | PRN
  Administered 2024-11-27: 75 mL via INTRAVENOUS

## 2024-11-27 NOTE — Discharge Instructions (Signed)
 You have a skin infection to your nose which I think is swelling into your eyelid.  We were able to drain the abscess/infection from your nose at the bedside.  I am changing your antibiotic to a stronger one and this combination of draining your abscess along with stronger antibiotic should resolve your symptoms.  Please call the ear nose and throat listed for a follow-up appointment.  Return to the emergency department with any new or suddenly worsening symptoms such as fever, vision changes, uncontrollable headaches.  I have called in some pain medication.  Do not take this with alcohol or while driving.  This will cause constipation and so I have called in some constipation medication as well.  This medication can be habit-forming.

## 2024-11-27 NOTE — ED Triage Notes (Signed)
 Obvious right eye lod swelling , abscess noted to right upper nose area .  Was treated last week for it yet it got worse .

## 2024-11-27 NOTE — ED Provider Notes (Signed)
 Emergency Department Provider Note   I have reviewed the triage vital signs and the nursing notes.   HISTORY  Chief Complaint Abscess   HPI Scott Stewart is a 33 y.o. male with past reviewed below, although prior negative HIV results so unsure regarding this dx, presents to the emergency department with worsening nasal pain and eye swelling.  He was seen on 11/27 with his presentation.  CT scan at that time showed no evidence of orbital cellulitis.  He was started on cefdinir .  He has been compliant with this medication but pain and swelling to the base of the nose and right eye have increased.  No fevers.  Past Medical History:  Diagnosis Date   Bipolar 1 disorder (HCC)    CHF (congestive heart failure) (HCC)    HIV (human immunodeficiency virus infection) (HCC)    Lupus    Seizures (HCC)     Review of Systems  Constitutional: No fever/chills Eyes: Positive right eye swelling.  ENT: Nose pain.  Cardiovascular: Denies chest pain. Respiratory: Denies shortness of breath. Gastrointestinal: No abdominal pain.  ____________________________________________   PHYSICAL EXAM:  VITAL SIGNS: ED Triage Vitals  Encounter Vitals Group     BP 11/27/24 1000 (!) 138/94     Pulse Rate 11/27/24 1000 88     Resp 11/27/24 1000 18     Temp 11/27/24 1000 98.6 F (37 C)     Temp src --      SpO2 11/27/24 1000 100 %     Weight 11/27/24 0959 156 lb (70.8 kg)   Constitutional: Alert and oriented. Well appearing and in no acute distress. Eyes: Conjunctivae are normal.  Periorbital edema mainly in the upper lid of the right eye.  Extraocular movements are intact with some reported discomfort with these movements. Head: Atraumatic. Nose: No congestion/rhinnorhea.  Swelling at the base of the nose on the right adjacent to the medial right eye.  Mouth/Throat: Mucous membranes are moist.  Oropharynx non-erythematous. Neck: No stridor.   Cardiovascular: Normal rate, regular rhythm. Good  peripheral circulation. Grossly normal heart sounds.   Respiratory: Normal respiratory effort.  No retractions. Lungs CTAB. Gastrointestinal: No distention.  Neurologic:  Normal speech and language. No gross focal neurologic deficits are appreciated.  Skin:  Skin is warm, dry and intact.   ____________________________________________   LABS (all labs ordered are listed, but only abnormal results are displayed)  Labs Reviewed  BASIC METABOLIC PANEL WITH GFR  CBC WITH DIFFERENTIAL/PLATELET  LACTIC ACID, PLASMA   ____________________________________________  RADIOLOGY  No results found.  ____________________________________________   PROCEDURES  Procedure(s) performed:   Procedures  None  ____________________________________________   INITIAL IMPRESSION / ASSESSMENT AND PLAN / ED COURSE  Pertinent labs & imaging results that were available during my care of the patient were reviewed by me and considered in my medical decision making (see chart for details).   This patient is Presenting for Evaluation of face/eye pain, which does require a range of treatment options, and is a complaint that involves a high risk of morbidity and mortality.  The Differential Diagnoses include face cellulitis, abscess, orbital cellulitis, periorbital cellulitis, etc.  Critical Interventions-    Medications  morphine  (PF) 4 MG/ML injection 4 mg (has no administration in time range)  ondansetron  (ZOFRAN ) injection 4 mg (has no administration in time range)  clindamycin  (CLEOCIN ) IVPB 600 mg (has no administration in time range)    Reassessment after intervention: pain improved.   Clinical Laboratory Tests Ordered, included  Radiologic Tests Ordered, included ct max face. I independently interpreted the images and agree with radiology interpretation.   Cardiac Monitor Tracing which shows NSR.    Social Determinants of Health Risk patient is a smoker.   Consult complete  with  Medical Decision Making: Summary:  Patient presents the emergency department with worsening symptoms.  He has swelling at the base of the nose on the right and periorbital edema.  Some pain with extraocular movements.  He did have a CT scan 4 days prior but symptoms have progressed and worsened since then.  Plan to repeat scan to evaluate for orbital cellulitis and started on clinda.  Reevaluation with update and discussion with   ***Considered admission***  Patient's presentation is most consistent with acute presentation with potential threat to life or bodily function.   Disposition:   ____________________________________________  FINAL CLINICAL IMPRESSION(S) / ED DIAGNOSES  Final diagnoses:  None     NEW OUTPATIENT MEDICATIONS STARTED DURING THIS VISIT:  New Prescriptions   No medications on file    Note:  This document was prepared using Dragon voice recognition software and may include unintentional dictation errors.  Fonda Law, MD, [facility]

## 2024-11-28 ENCOUNTER — Other Ambulatory Visit: Payer: Self-pay

## 2024-11-28 ENCOUNTER — Other Ambulatory Visit (HOSPITAL_BASED_OUTPATIENT_CLINIC_OR_DEPARTMENT_OTHER): Payer: Self-pay
